# Patient Record
Sex: Male | Born: 1955 | Hispanic: Yes | Marital: Married | Smoking: Former smoker
Health system: Southern US, Community
[De-identification: ages and names within clinical notes are randomized; demographics above are authoritative.]

## PROBLEM LIST (undated history)

## (undated) DIAGNOSIS — G4730 Sleep apnea, unspecified: Secondary | ICD-10-CM

## (undated) DIAGNOSIS — I10 Essential (primary) hypertension: Secondary | ICD-10-CM

## (undated) DIAGNOSIS — C801 Malignant (primary) neoplasm, unspecified: Secondary | ICD-10-CM

## (undated) DIAGNOSIS — K759 Inflammatory liver disease, unspecified: Secondary | ICD-10-CM

## (undated) DIAGNOSIS — K219 Gastro-esophageal reflux disease without esophagitis: Secondary | ICD-10-CM

## (undated) DIAGNOSIS — E785 Hyperlipidemia, unspecified: Secondary | ICD-10-CM

## (undated) DIAGNOSIS — U071 COVID-19: Secondary | ICD-10-CM

## (undated) DIAGNOSIS — Z87442 Personal history of urinary calculi: Secondary | ICD-10-CM

## (undated) DIAGNOSIS — C189 Malignant neoplasm of colon, unspecified: Secondary | ICD-10-CM

## (undated) HISTORY — PX: COLON SURGERY: SHX602

## (undated) HISTORY — DX: Malignant (primary) neoplasm, unspecified: C80.1

## (undated) HISTORY — PX: CARPAL TUNNEL RELEASE: SHX101

## (undated) HISTORY — DX: Essential (primary) hypertension: I10

## (undated) HISTORY — PX: COLONOSCOPY: SHX174

## (undated) HISTORY — PX: TONSILLECTOMY: SUR1361

---

## 2006-08-27 DIAGNOSIS — C801 Malignant (primary) neoplasm, unspecified: Secondary | ICD-10-CM

## 2006-08-27 HISTORY — DX: Malignant (primary) neoplasm, unspecified: C80.1

## 2007-08-28 HISTORY — PX: COLON SURGERY: SHX602

## 2013-04-29 ENCOUNTER — Ambulatory Visit: Payer: Self-pay | Admitting: Physician Assistant

## 2013-04-29 VITALS — BP 126/80 | HR 62 | Temp 98.3°F | Resp 16 | Ht 65.0 in | Wt 156.0 lb

## 2013-04-29 DIAGNOSIS — Z0289 Encounter for other administrative examinations: Secondary | ICD-10-CM

## 2013-04-29 NOTE — Progress Notes (Signed)
UA SP.GR 1.030, Protein neg, blood neg, sugar neg

## 2013-04-29 NOTE — Progress Notes (Signed)
Patient ID: Mark Chapman MRN: [id number], DOB: May 18, 1956 57 y.o. Date of Encounter: 04/29/2013, 3:50 PM  Primary Physician: No primary provider on file.  Chief Complaint: DOT Physical   HPI: 57 y.o. male with history noted below here for DOT physical. Doing well. No issues/complaints. History of well controlled hypertension. On Lisinopril/HCTZ 20/12.5 mg daily. Recently moved from [place]. Does not have a PCP in [place] yet. Needs to get medication transferred to [facility]. Has not taken in 1 week. BP still looks good. No CP, chest tightness, HA, vision changes, or focal deficits. History of colon cancer 2008. Had recent colonoscopy this past year- no sign of recurrence. No issues. No history of sleep apnea, has had sleep study documenting this.  No tobacco, ETOH, or drugs. 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   Review of Systems: Consitutional: No fever, chills, fatigue, night sweats, lymphadenopathy, or weight changes. Eyes: No visual changes, eye redness, or discharge. ENT/Mouth: Ears: No otalgia, tinnitus, hearing loss, discharge. Nose: No congestion, rhinorrhea, sinus pain, or epistaxis. Throat: No sore throat, post nasal drip, or teeth pain. Cardiovascular: No CP, palpitations, diaphoresis, DOE, edema, orthopnea, PND. Respiratory: No cough, hemoptysis, SOB, or wheezing. Gastrointestinal: No anorexia, dysphagia, reflux, pain, nausea, vomiting, hematemesis, diarrhea, constipation, BRBPR, or melena. Genitourinary: No dysuria, frequency, urgency, hematuria, incontinence, nocturia, decreased urinary stream, discharge, impotence, or testicular pain/masses. Musculoskeletal: No decreased ROM, myalgias, stiffness, joint swelling, or weakness. Skin: No rash, erythema, lesion changes, pain, warmth, jaundice, or pruritis. Neurological: No headache, dizziness, syncope, seizures, tremors, memory loss, coordination problems, or paresthesias. Psychological: No anxiety, depression,  hallucinations, SI/HI. Endocrine: No fatigue, polydipsia, polyphagia, polyuria, or known diabetes.   Past Medical History  Diagnosis Date  . Hypertension   . Cancer 2008    Colon     Past Surgical History  Procedure Laterality Date  . Colon surgery      Home Meds:  Prior to Admission medications   Not on File    Allergies: No Known Allergies  History   Social History  . Marital Status: Married    Spouse Name: N/A    Number of Children: N/A  . Years of Education: N/A   Occupational History  . Not on file.   Social History Main Topics  . Smoking status: Never Smoker   . Smokeless tobacco: Not on file  . Alcohol Use: Not on file  . Drug Use: Not on file  . Sexual Activity: Not on file   Other Topics Concern  . Not on file   Social History Narrative  . No narrative on file    History reviewed. No pertinent family history.  Physical Exam: Blood pressure 126/80, pulse 62, temperature 98.3 F (36.8 C), temperature source Oral, resp. rate 16, height 5\' 5"  (1.651 m), weight 156 lb (70.761 kg), SpO2 97.00%.  General: Well developed, well nourished, in no acute distress. HEENT: Normocephalic, atraumatic. Conjunctiva pink, sclera non-icteric. Pupils 2 mm constricting to 1 mm, round, regular, and equally reactive to light and accomodation. EOMI. Internal auditory canal clear. TMs with good cone of light and without pathology. Nasal mucosa pink. Nares are without discharge. No sinus tenderness. Oral mucosa pink. Dentition normal. Pharynx without exudate.   Neck: Supple. Trachea midline. No thyromegaly. Full ROM. No lymphadenopathy. Lungs: Clear to auscultation bilaterally without wheezes, rales, or rhonchi. Breathing is of normal effort and unlabored. Cardiovascular: RRR with S1 S2. No murmurs, rubs, or gallops appreciated. Distal pulses 2+ symmetrically. No  carotid or abdominal bruits. Abdomen: Soft, non-tender, non-distended with normoactive bowel sounds. No  hepatosplenomegaly or masses. No rebound/guarding. No CVA tenderness. Without hernias.  Genitourinary: Uncircumcised male. No penile lesions. Testes descended bilaterally, and smooth without tenderness or masses.  Musculoskeletal: Full range of motion and 5/5 strength throughout. Without swelling, atrophy, tenderness, crepitus, or warmth. Extremities without clubbing, cyanosis, or edema. Calves supple. Skin: Warm and moist without erythema, ecchymosis, wounds, or rash. Neuro: A+Ox3. CN II-XII grossly intact. Moves all extremities spontaneously. Full sensation throughout. Normal gait. DTR 2+ throughout upper and lower extremities. Finger to nose intact. Psych:  Responds to questions appropriately with a normal affect.    Assessment/Plan:  57 y.o. male here for DOT physical with history of hypertension -Cleared -1 year card issued secondary to history of hypertension  -Closely monitor BP -Form completed -Find PCP, offered our assistance  -RTC prn  Signed, Eula Listen, PA-C 04/29/2013 3:50 PM

## 2013-05-12 ENCOUNTER — Encounter: Payer: Self-pay | Admitting: Physician Assistant

## 2013-10-31 ENCOUNTER — Emergency Department (HOSPITAL_COMMUNITY)
Admission: EM | Admit: 2013-10-31 | Discharge: 2013-10-31 | Disposition: A | Discharge status: Home / Self Care | Payer: Medicare Other | Attending: Emergency Medicine | Admitting: Emergency Medicine

## 2013-10-31 ENCOUNTER — Encounter (HOSPITAL_COMMUNITY): Payer: Self-pay | Admitting: Emergency Medicine

## 2013-10-31 ENCOUNTER — Emergency Department (HOSPITAL_COMMUNITY): Payer: Medicare Other

## 2013-10-31 DIAGNOSIS — Z87891 Personal history of nicotine dependence: Secondary | ICD-10-CM | POA: Insufficient documentation

## 2013-10-31 DIAGNOSIS — R0789 Other chest pain: Secondary | ICD-10-CM | POA: Insufficient documentation

## 2013-10-31 DIAGNOSIS — E785 Hyperlipidemia, unspecified: Secondary | ICD-10-CM | POA: Insufficient documentation

## 2013-10-31 DIAGNOSIS — R079 Chest pain, unspecified: Secondary | ICD-10-CM

## 2013-10-31 DIAGNOSIS — I10 Essential (primary) hypertension: Secondary | ICD-10-CM | POA: Insufficient documentation

## 2013-10-31 DIAGNOSIS — F411 Generalized anxiety disorder: Secondary | ICD-10-CM | POA: Insufficient documentation

## 2013-10-31 DIAGNOSIS — Z85038 Personal history of other malignant neoplasm of large intestine: Secondary | ICD-10-CM | POA: Insufficient documentation

## 2013-10-31 DIAGNOSIS — R42 Dizziness and giddiness: Secondary | ICD-10-CM | POA: Insufficient documentation

## 2013-10-31 DIAGNOSIS — Z79899 Other long term (current) drug therapy: Secondary | ICD-10-CM | POA: Insufficient documentation

## 2013-10-31 DIAGNOSIS — F419 Anxiety disorder, unspecified: Secondary | ICD-10-CM

## 2013-10-31 DIAGNOSIS — R0602 Shortness of breath: Secondary | ICD-10-CM | POA: Insufficient documentation

## 2013-10-31 HISTORY — DX: Hyperlipidemia, unspecified: E78.5

## 2013-10-31 HISTORY — DX: Malignant neoplasm of colon, unspecified: C18.9

## 2013-10-31 LAB — CBC
HEMATOCRIT: 43.5 % (ref 39.0–52.0)
Hemoglobin: 15.3 g/dL (ref 13.0–17.0)
MCH: 32.3 pg (ref 26.0–34.0)
MCHC: 35.2 g/dL (ref 30.0–36.0)
MCV: 92 fL (ref 78.0–100.0)
Platelets: 232 10*3/uL (ref 150–400)
RBC: 4.73 MIL/uL (ref 4.22–5.81)
RDW: 12.7 % (ref 11.5–15.5)
WBC: 7.3 10*3/uL (ref 4.0–10.5)

## 2013-10-31 LAB — BASIC METABOLIC PANEL
BUN: 13 mg/dL (ref 6–23)
CHLORIDE: 98 meq/L (ref 96–112)
CO2: 28 mEq/L (ref 19–32)
Calcium: 9.6 mg/dL (ref 8.4–10.5)
Creatinine, Ser: 0.88 mg/dL (ref 0.50–1.35)
GFR calc non Af Amer: >90 mL/min (ref >90)
Glucose, Bld: 89 mg/dL (ref 70–99)
POTASSIUM: 3.6 meq/L — AB (ref 3.7–5.3)
SODIUM: 137 meq/L (ref 137–147)

## 2013-10-31 LAB — I-STAT TROPONIN, ED: Troponin i, poc: 0 ng/mL (ref 0.00–0.08)

## 2013-10-31 MED ORDER — LORAZEPAM 2 MG/ML IJ SOLN
1.0000 mg | Freq: Once | INTRAMUSCULAR | Status: AC
  Administered 2013-10-31: 1 mg via INTRAVENOUS
  Filled 2013-10-31: qty 1

## 2013-10-31 NOTE — Discharge Instructions (Signed)
Chest Pain (Nonspecific) °It is often hard to give a specific diagnosis for the cause of chest pain. There is always a chance that your pain could be related to something serious, such as a heart attack or a blood clot in the lungs. You need to follow up with your caregiver for further evaluation. °CAUSES  °· Heartburn. °· Pneumonia or bronchitis. °· Anxiety or stress. °· Inflammation around your heart (pericarditis) or lung (pleuritis or pleurisy). °· A blood clot in the lung. °· A collapsed lung (pneumothorax). It can develop suddenly on its own (spontaneous pneumothorax) or from injury (trauma) to the chest. °· Shingles infection (herpes zoster virus). °The chest wall is composed of bones, muscles, and cartilage. Any of these can be the source of the pain. °· The bones can be bruised by injury. °· The muscles or cartilage can be strained by coughing or overwork. °· The cartilage can be affected by inflammation and become sore (costochondritis). °DIAGNOSIS  °Lab tests or other studies, such as X-rays, electrocardiography, stress testing, or cardiac imaging, may be needed to find the cause of your pain.  °TREATMENT  °· Treatment depends on what may be causing your chest pain. Treatment may include: °· Acid blockers for heartburn. °· Anti-inflammatory medicine. °· Pain medicine for inflammatory conditions. °· Antibiotics if an infection is present. °· You may be advised to change lifestyle habits. This includes stopping smoking and avoiding alcohol, caffeine, and chocolate. °· You may be advised to keep your head raised (elevated) when sleeping. This reduces the chance of acid going backward from your stomach into your esophagus. °· Most of the time, nonspecific chest pain will improve within 2 to 3 days with rest and mild pain medicine. °HOME CARE INSTRUCTIONS  °· If antibiotics were prescribed, take your antibiotics as directed. Finish them even if you start to feel better. °· For the next few days, avoid physical  activities that bring on chest pain. Continue physical activities as directed. °· Do not smoke. °· Avoid drinking alcohol. °· Only take over-the-counter or prescription medicine for pain, discomfort, or fever as directed by your caregiver. °· Follow your caregiver's suggestions for further testing if your chest pain does not go away. °· Keep any follow-up appointments you made. If you do not go to an appointment, you could develop lasting (chronic) problems with pain. If there is any problem keeping an appointment, you must call to reschedule. °SEEK MEDICAL CARE IF:  °· You think you are having problems from the medicine you are taking. Read your medicine instructions carefully. °· Your chest pain does not go away, even after treatment. °· You develop a rash with blisters on your chest. °SEEK IMMEDIATE MEDICAL CARE IF:  °· You have increased chest pain or pain that spreads to your arm, neck, jaw, back, or abdomen. °· You develop shortness of breath, an increasing cough, or you are coughing up blood. °· You have severe back or abdominal pain, feel nauseous, or vomit. °· You develop severe weakness, fainting, or chills. °· You have a fever. °THIS IS AN EMERGENCY. Do not wait to see if the pain will go away. Get medical help at once. Call your local emergency services (911 in [place] Do not drive yourself to the hospital. °MAKE SURE YOU:  °· Understand these instructions. °· Will watch your condition. °· Will get help right away if you are not doing well or get worse. °Document Released: 05/23/2005 Document Revised: 11/05/2011 Document Reviewed: 03/18/2008 °ExitCare® Patient Information ©2014 [employer]   [employer]. ° °Panic Attacks °Panic attacks are sudden, short-lived surges of severe anxiety, fear, or discomfort. They may occur for no reason when you are relaxed, when you are anxious, or when you are sleeping. Panic attacks may occur for a number of reasons:  °· Healthy people occasionally have panic attacks in extreme,  life-threatening situations, such as war or natural disasters. Normal anxiety is a protective mechanism of the body that helps us react to danger (fight or flight response). °· Panic attacks are often seen with anxiety disorders, such as panic disorder, social anxiety disorder, generalized anxiety disorder, and phobias. Anxiety disorders cause excessive or uncontrollable anxiety. They may interfere with your relationships or other life activities. °· Panic attacks are sometimes seen with other mental illnesses such as depression and posttraumatic stress disorder. °· Certain medical conditions, prescription medicines, and drugs of abuse can cause panic attacks. °SYMPTOMS  °Panic attacks start suddenly, peak within 20 minutes, and are accompanied by four or more of the following symptoms: °· Pounding heart or fast heart rate (palpitations). °· Sweating. °· Trembling or shaking. °· Shortness of breath or feeling smothered. °· Feeling choked. °· Chest pain or discomfort. °· Nausea or strange feeling in your stomach. °· Dizziness, lightheadedness, or feeling like you will faint. °· Chills or hot flushes. °· Numbness or tingling in your lips or hands and feet. °· Feeling that things are not real or feeling that you are not yourself. °· Fear of losing control or going crazy. °· Fear of dying. °Some of these symptoms can mimic serious medical conditions. For example, you may think you are having a heart attack. Although panic attacks can be very scary, they are not life threatening. °DIAGNOSIS  °Panic attacks are diagnosed through an assessment by your health care provider. Your health care provider will ask questions about your symptoms, such as where and when they occurred. Your health care provider will also ask about your medical history and use of alcohol and drugs, including prescription medicines. Your health care provider may order blood tests or other studies to rule out a serious medical condition. Your health  care provider may refer you to a mental health professional for further evaluation. °TREATMENT  °· Most healthy people who have one or two panic attacks in an extreme, life-threatening situation will not require treatment. °· The treatment for panic attacks associated with anxiety disorders or other mental illness typically involves counseling with a mental health professional, medicine, or a combination of both. Your health care provider will help determine what treatment is best for you. °· Panic attacks due to physical illness usually goes away with treatment of the illness. If prescription medicine is causing panic attacks, talk with your health care provider about stopping the medicine, decreasing the dose, or substituting another medicine. °· Panic attacks due to alcohol or drug abuse goes away with abstinence. Some adults need professional help in order to stop drinking or using drugs. °HOME CARE INSTRUCTIONS  °· Take all your medicines as prescribed.   °· Check with your health care provider before starting new prescription or over-the-counter medicines. °· Keep all follow up appointments with your health care provider. °SEEK MEDICAL CARE IF: °· You are not able to take your medicines as prescribed. °· Your symptoms do not improve or get worse. °SEEK IMMEDIATE MEDICAL CARE IF:  °· You experience panic attack symptoms that are different than your usual symptoms. °· You have serious thoughts about hurting yourself or others. °· You are taking medicine for   panic attacks and have a serious side effect. °MAKE SURE YOU: °· Understand these instructions. °· Will watch your condition. °· Will get help right away if you are not doing well or get worse. °Document Released: 08/13/2005 Document Revised: 06/03/2013 Document Reviewed: 03/27/2013 °ExitCare® Patient Information ©2014 [employer]. ° °

## 2013-10-31 NOTE — ED Provider Notes (Signed)
CSN: [id number]     Arrival date & time 10/31/13  0941 History   First MD Initiated Contact with Patient 10/31/13 [phone]     Chief Complaint  Patient presents with  . Chest Pain  . Shortness of Breath     (Consider location/radiation/quality/duration/timing/severity/associated sxs/prior Treatment) HPI Comments: Patient is a 58 year old male with a past medical history of hypertension, hyperlipidemia and colon cancer who presents to the emergency department complaining of intermittent chest pain x 4 years. Symptoms are described as chest pressure, occasionally shooting down his left arm. He cannot recall anything in particular making his symptoms, or go. States in 2011 he had a stress test in [place] because he was complaining of chest pressure, stress test results were unremarkable, concerned because they injected him with a dye, and he believes the dye may be causing his symptoms. Admits to associated shortness of breath and occasional lightheadedness. States he saw a new PCP recently Dr. Moreen Fowler who told him he had high cholesterol, was put on Lipitor and told to start exercising. Denies fever, chills, extremity edema, cough.  Patient is a 58 y.o. male presenting with chest pain and shortness of breath. The history is provided by the patient.  Chest Pain Associated symptoms: shortness of breath   Shortness of Breath Associated symptoms: chest pain     Past Medical History  Diagnosis Date  . Hypertension   . Cancer 2008    Colon  . Colon cancer   . Hyperlipidemia    Past Surgical History  Procedure Laterality Date  . Colon surgery     History reviewed. No pertinent family history. History  Substance Use Topics  . Smoking status: Former Research scientist (life sciences)  . Smokeless tobacco: Not on file  . Alcohol Use: No    Review of Systems  Respiratory: Positive for shortness of breath.   Cardiovascular: Positive for chest pain.  Neurological: Positive for light-headedness.  All other systems  reviewed and are negative.      Allergies  Review of patient's allergies indicates no known allergies.  Home Medications   Current Outpatient Rx  Name  Route  Sig  Dispense  Refill  . atorvastatin (LIPITOR) 20 MG tablet   Oral   Take 20 mg by mouth daily.         Marland Kitchen lisinopril-hydrochlorothiazide (PRINZIDE,ZESTORETIC) 20-12.5 MG per tablet   Oral   Take 1 tablet by mouth daily.         . Multiple Vitamins-Minerals (MULTIVITAMIN PO)   Oral   Take 1 tablet by mouth daily.         Marland Kitchen omeprazole (PRILOSEC) 40 MG capsule   Oral   Take 40 mg by mouth daily as needed (for stomach pain).         Marland Kitchen oxyCODONE-acetaminophen (PERCOCET) 10-325 MG per tablet   Oral   Take 1 tablet by mouth every 4 (four) hours as needed for pain.         . vitamin B-12 (CYANOCOBALAMIN) 1000 MCG tablet   Oral   Take 1,000 mcg by mouth daily.          BP 135/91  Pulse 70  Temp(Src) 97.2 F (36.2 C) (Oral)  Resp 8  Ht 5\' 7"  (1.702 m)  Wt 163 lb 3.2 oz (74.027 kg)  BMI 25.55 kg/m2  SpO2 100% Physical Exam  Nursing note and vitals reviewed. Constitutional: He is oriented to person, place, and time. He appears well-developed and well-nourished. No distress.  HENT:  Head:  Normocephalic and atraumatic.  Mouth/Throat: Oropharynx is clear and moist.  Eyes: Conjunctivae and EOM are normal. Pupils are equal, round, and reactive to light.  Neck: Normal range of motion. Neck supple. No JVD present.  Cardiovascular: Normal rate, regular rhythm, normal heart sounds and intact distal pulses.   No extremity edema.  Pulmonary/Chest: Effort normal and breath sounds normal. He exhibits no tenderness.  Abdominal: Soft. Bowel sounds are normal. There is no tenderness.  Musculoskeletal: Normal range of motion. He exhibits no edema.  Neurological: He is alert and oriented to person, place, and time.  Moves limbs without ataxia. Speech fluent, goal oriented. Equal grip strength bilateral.  Skin:  Skin is warm and dry. He is not diaphoretic.  Psychiatric: His behavior is normal. His mood appears anxious.    ED Course  Procedures (including critical care time) Labs Review Labs Reviewed  BASIC METABOLIC PANEL - Abnormal; Notable for the following:    Potassium 3.6 (*)    All other components within normal limits  CBC  I-STAT TROPOININ, ED   Imaging Review Dg Chest 2 View  10/31/2013   CLINICAL DATA:  Chest pain  EXAM: CHEST  2 VIEW  COMPARISON:  None.  FINDINGS: The heart size and mediastinal contours are within normal limits. Both lungs are clear. The visualized skeletal structures are unremarkable.  IMPRESSION: No active cardiopulmonary disease.   Electronically Signed   By: Kerby Moors M.D.   On: 10/31/2013 12:16     EKG Interpretation   Date/Time:  Saturday October 31 2013 09:43:13 EST Ventricular Rate:  81 PR Interval:  142 QRS Duration: 86 QT Interval:  376 QTC Calculation: 436 R Axis:   66 Text Interpretation:  Normal sinus rhythm Normal ECG Confirmed by ZAVITZ   MD, JOSHUA ([id number]) on 10/31/2013 12:39:42 PM      MDM   Final diagnoses:  Chest pain  Anxiety    Patient presenting with chest pain, intermittent for 4 years. He is well appearing and in no apparent distress, stable vital signs. Patient seems anxious, concerned that a dye injected with a stress test back in 2011 may be causing his symptoms. Stress test at that time unremarkable. Doubt cardiac or pulmonary in origin. Labs pending. EKG unremarkable. Plan to give Ativan, if labs normal, discharge home with outpatient cardiology followup. 12:42 PM Workup unremarkable. Pt reports complete improvement of his symptoms with Ativan. Symptoms most likely related to anxiety. I advised him to followup with his PCP to discuss anxiety, also given resources for cardiology followup. Low risk HEART score 2. Stable for d/c. Case discussed with attending Dr. Reather Converse who also evaluated patient and agrees with plan of  care.   Illene Labrador, PA-C 10/31/13 1249

## 2013-10-31 NOTE — ED Notes (Signed)
Pt had stress test in [place] in 2011. Ever since this test he has had intermittent chest pressure. He states it occasionally shoots down his left arm. Pt denies N/V, he does state that he is light headed at times.

## 2013-11-01 NOTE — ED Provider Notes (Signed)
Medical screening examination/treatment/procedure(s) were conducted as a shared visit with non-physician practitioner(s) or resident  and myself.  I personally evaluated the patient during the encounter and agree with the findings and plan unless otherwise indicated.    I have personally reviewed any xrays and/ or EKG's with the provider and I agree with interpretation.   Atypical chest pain for 4 years since having contrast dye.  Pt low risk cardiac.  Pt has had constant pain for one month.  No exertional or diaphoresis.  Pt sxs resolved in ED.  EKG no acute findings.  No concern for PE at this time. Exam RRR, lungs clear, no leg pain or swelling, well appearing.      EKG Interpretation   Date/Time:  Saturday October 31 2013 09:43:13 EST Ventricular Rate:  81 PR Interval:  142 QRS Duration: 86 QT Interval:  376 QTC Calculation: 436 R Axis:   66 Text Interpretation:  Normal sinus rhythm Normal ECG Confirmed by Angelina Venard   MD, [place]lds ([id number]) on 10/31/2013 12:39:42 PM     Atypical chest pain   Mariea Clonts, MD 11/01/13 2144

## 2013-11-11 ENCOUNTER — Ambulatory Visit: Payer: Medicare Other | Admitting: Cardiology

## 2013-11-18 ENCOUNTER — Ambulatory Visit: Payer: Medicare Other | Admitting: Cardiology

## 2013-11-25 ENCOUNTER — Encounter: Payer: Self-pay | Admitting: Cardiology

## 2013-11-25 ENCOUNTER — Ambulatory Visit: Payer: Medicare Other | Attending: Cardiology | Admitting: Cardiology

## 2013-11-25 VITALS — BP 128/80 | HR 79 | Temp 98.6°F | Resp 16 | Ht 66.0 in | Wt 164.0 lb

## 2013-11-25 DIAGNOSIS — F329 Major depressive disorder, single episode, unspecified: Secondary | ICD-10-CM

## 2013-11-25 DIAGNOSIS — F419 Anxiety disorder, unspecified: Secondary | ICD-10-CM

## 2013-11-25 DIAGNOSIS — F411 Generalized anxiety disorder: Secondary | ICD-10-CM | POA: Insufficient documentation

## 2013-11-25 DIAGNOSIS — E785 Hyperlipidemia, unspecified: Secondary | ICD-10-CM | POA: Insufficient documentation

## 2013-11-25 DIAGNOSIS — F341 Dysthymic disorder: Secondary | ICD-10-CM

## 2013-11-25 DIAGNOSIS — F3289 Other specified depressive episodes: Secondary | ICD-10-CM | POA: Insufficient documentation

## 2013-11-25 DIAGNOSIS — R079 Chest pain, unspecified: Secondary | ICD-10-CM

## 2013-11-25 DIAGNOSIS — I10 Essential (primary) hypertension: Secondary | ICD-10-CM | POA: Insufficient documentation

## 2013-11-25 NOTE — Progress Notes (Signed)
HPI [name] is a 58 year old male who comes in today referred for chest pain. He was recently evaluated in emergency room for a pressure over his left breast. Cardiac markers were negative, EKG was normal, and chest x-ray was normal.  She has a lot of anxiety and depression. 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 He shared that he has a bad marriage. He had a bad argument last night which made his discomfort worse.  It is not related to exertion or activity. He denies any cough, hemoptysis, nausea, vomiting, dysphagia, palpitations or shortness of breath.  Past Medical History  Diagnosis Date  . Hypertension   . Cancer 2008    Colon  . Colon cancer   . Hyperlipidemia     Current Outpatient Prescriptions  Medication Sig Dispense Refill  . atorvastatin (LIPITOR) 20 MG tablet Take 20 mg by mouth daily.      Marland Kitchen lisinopril-hydrochlorothiazide (PRINZIDE,ZESTORETIC) 20-12.5 MG per tablet Take 1 tablet by mouth daily.      . Multiple Vitamins-Minerals (MULTIVITAMIN PO) Take 1 tablet by mouth daily.      Marland Kitchen omeprazole (PRILOSEC) 40 MG capsule Take 40 mg by mouth daily as needed (for stomach pain).      Marland Kitchen oxyCODONE-acetaminophen (PERCOCET) 10-325 MG per tablet Take 1 tablet by mouth every 4 (four) hours as needed for pain.      . vitamin B-12 (CYANOCOBALAMIN) 1000 MCG tablet Take 1,000 mcg by mouth daily.       No current facility-administered medications for this visit.    No Known Allergies  History reviewed. No pertinent family history.  History   Social History  . Marital Status: Married    Spouse Name: N/A    Number of Children: N/A  . Years of Education: N/A   Occupational History  . Not on file.   Social History Main Topics  . Smoking status: Former Research scientist (life sciences)  . Smokeless tobacco: Not on file  . Alcohol Use: No  . Drug Use: No  . Sexual Activity: Yes   Other Topics Concern  . Not on file   Social History Narrative  . No narrative on file    ROS ALL  NEGATIVE EXCEPT THOSE NOTED IN HPI  PE  General Appearance: well developed, well nourished in no acute distress HEENT: symmetrical face, PERRLA, good dentition  Neck: no JVD, thyromegaly, or adenopathy, trachea midline Chest: symmetric without deformity, no chest Mark Chapman tenderness Cardiac: PMI non-displaced, RRR, normal S1, S2, no gallop or murmur Lung: clear to ausculation and percussion Vascular: all pulses full without bruits  Abdominal: nondistended, nontender, good bowel sounds, no HSM, no bruits Extremities: no cyanosis, clubbing or edema, no sign of DVT, no varicosities  Skin: normal color, no rashes Neuro: alert and oriented x 3, non-focal Pysch: Anxious  EKG  BMET    Component Value Date/Time   NA 137 10/31/2013 1033   K 3.6* 10/31/2013 1033   CL 98 10/31/2013 1033   CO2 28 10/31/2013 1033   GLUCOSE 89 10/31/2013 1033   BUN 13 10/31/2013 1033   CREATININE 0.88 10/31/2013 1033   CALCIUM 9.6 10/31/2013 1033   GFRNONAA >90 10/31/2013 1033   GFRAA >90 10/31/2013 1033    Lipid Panel  No results found for this basename: chol, trig, hdl, cholhdl, vldl, ldlcalc    CBC    Component Value Date/Time   WBC 7.3 10/31/2013 1033   RBC 4.73 10/31/2013 1033   HGB 15.3 10/31/2013 1033   HCT  43.5 10/31/2013 1033   PLT 232 10/31/2013 1033   MCV 92.0 10/31/2013 1033   MCH 32.3 10/31/2013 1033   MCHC 35.2 10/31/2013 1033   RDW 12.7 10/31/2013 1033

## 2013-11-25 NOTE — Patient Instructions (Signed)
Pt instructed to f/u with provided Psychiatry referral

## 2013-11-25 NOTE — Addendum Note (Signed)
Addended by: Candie Chroman D on: 11/25/2013 04:32 PM   Modules accepted: Orders

## 2013-11-25 NOTE — Assessment & Plan Note (Signed)
This is noncardiac. There is no evidence of costochondritis or muscle skeletal disorder. I think this is mostly related to his anxiety. No further cardiac workup indicated.

## 2013-11-25 NOTE — Progress Notes (Signed)
Pt here to f/u with Dr. Verl Blalock for chest pain  C/o intermit tightness,nonradiating Work-up in ER negative for Cardiac issues possibly anxiety Pt will f/u with [facility] for mental health referral

## 2014-09-24 DIAGNOSIS — K219 Gastro-esophageal reflux disease without esophagitis: Secondary | ICD-10-CM | POA: Diagnosis not present

## 2014-09-24 DIAGNOSIS — Z23 Encounter for immunization: Secondary | ICD-10-CM | POA: Diagnosis not present

## 2014-09-24 DIAGNOSIS — F4310 Post-traumatic stress disorder, unspecified: Secondary | ICD-10-CM | POA: Diagnosis not present

## 2014-09-24 DIAGNOSIS — I10 Essential (primary) hypertension: Secondary | ICD-10-CM | POA: Diagnosis not present

## 2014-09-24 DIAGNOSIS — F329 Major depressive disorder, single episode, unspecified: Secondary | ICD-10-CM | POA: Diagnosis not present

## 2014-09-24 DIAGNOSIS — E782 Mixed hyperlipidemia: Secondary | ICD-10-CM | POA: Diagnosis not present

## 2014-09-24 DIAGNOSIS — M25559 Pain in unspecified hip: Secondary | ICD-10-CM | POA: Diagnosis not present

## 2014-10-22 ENCOUNTER — Telehealth: Payer: Self-pay | Admitting: Emergency Medicine

## 2014-10-22 NOTE — Telephone Encounter (Signed)
Form was dropped off by wife on 09/30/2014 for completion (Hand[employer] Placard) Form is ready for pick-up will hand off to Carmela Hurt RN

## 2014-12-07 DIAGNOSIS — F332 Major depressive disorder, recurrent severe without psychotic features: Secondary | ICD-10-CM | POA: Diagnosis not present

## 2014-12-07 DIAGNOSIS — J309 Allergic rhinitis, unspecified: Secondary | ICD-10-CM | POA: Diagnosis not present

## 2015-03-25 DIAGNOSIS — E782 Mixed hyperlipidemia: Secondary | ICD-10-CM | POA: Diagnosis not present

## 2015-03-25 DIAGNOSIS — F4310 Post-traumatic stress disorder, unspecified: Secondary | ICD-10-CM | POA: Diagnosis not present

## 2015-03-25 DIAGNOSIS — K219 Gastro-esophageal reflux disease without esophagitis: Secondary | ICD-10-CM | POA: Diagnosis not present

## 2015-03-25 DIAGNOSIS — M25559 Pain in unspecified hip: Secondary | ICD-10-CM | POA: Diagnosis not present

## 2015-03-25 DIAGNOSIS — I10 Essential (primary) hypertension: Secondary | ICD-10-CM | POA: Diagnosis not present

## 2015-04-18 DIAGNOSIS — H5210 Myopia, unspecified eye: Secondary | ICD-10-CM | POA: Diagnosis not present

## 2015-04-18 DIAGNOSIS — H524 Presbyopia: Secondary | ICD-10-CM | POA: Diagnosis not present

## 2015-05-04 DIAGNOSIS — M25551 Pain in right hip: Secondary | ICD-10-CM | POA: Diagnosis not present

## 2015-05-07 DIAGNOSIS — H5203 Hypermetropia, bilateral: Secondary | ICD-10-CM | POA: Diagnosis not present

## 2015-05-07 DIAGNOSIS — H52209 Unspecified astigmatism, unspecified eye: Secondary | ICD-10-CM | POA: Diagnosis not present

## 2015-05-07 DIAGNOSIS — Z0100 Encounter for examination of eyes and vision without abnormal findings: Secondary | ICD-10-CM | POA: Diagnosis not present

## 2015-05-13 DIAGNOSIS — M1611 Unilateral primary osteoarthritis, right hip: Secondary | ICD-10-CM | POA: Diagnosis not present

## 2015-05-19 DIAGNOSIS — M1611 Unilateral primary osteoarthritis, right hip: Secondary | ICD-10-CM | POA: Diagnosis not present

## 2015-06-07 DIAGNOSIS — M1611 Unilateral primary osteoarthritis, right hip: Secondary | ICD-10-CM | POA: Diagnosis not present

## 2015-09-26 DIAGNOSIS — Z23 Encounter for immunization: Secondary | ICD-10-CM | POA: Diagnosis not present

## 2015-09-26 DIAGNOSIS — I10 Essential (primary) hypertension: Secondary | ICD-10-CM | POA: Diagnosis not present

## 2015-09-26 DIAGNOSIS — E782 Mixed hyperlipidemia: Secondary | ICD-10-CM | POA: Diagnosis not present

## 2015-09-26 DIAGNOSIS — Z85038 Personal history of other malignant neoplasm of large intestine: Secondary | ICD-10-CM | POA: Diagnosis not present

## 2015-09-26 DIAGNOSIS — F4310 Post-traumatic stress disorder, unspecified: Secondary | ICD-10-CM | POA: Diagnosis not present

## 2015-09-26 DIAGNOSIS — K219 Gastro-esophageal reflux disease without esophagitis: Secondary | ICD-10-CM | POA: Diagnosis not present

## 2015-09-26 DIAGNOSIS — M25559 Pain in unspecified hip: Secondary | ICD-10-CM | POA: Diagnosis not present

## 2015-09-26 DIAGNOSIS — B1920 Unspecified viral hepatitis C without hepatic coma: Secondary | ICD-10-CM | POA: Diagnosis not present

## 2015-12-19 DIAGNOSIS — Z79899 Other long term (current) drug therapy: Secondary | ICD-10-CM | POA: Diagnosis not present

## 2015-12-19 DIAGNOSIS — Z5181 Encounter for therapeutic drug level monitoring: Secondary | ICD-10-CM | POA: Diagnosis not present

## 2019-03-28 ENCOUNTER — Other Ambulatory Visit: Payer: Self-pay

## 2019-03-28 ENCOUNTER — Emergency Department (HOSPITAL_COMMUNITY)
Admission: EM | Admit: 2019-03-28 | Discharge: 2019-03-28 | Disposition: A | Discharge status: Home / Self Care | Payer: Medicare Other | Attending: Emergency Medicine | Admitting: Emergency Medicine

## 2019-03-28 ENCOUNTER — Encounter (HOSPITAL_COMMUNITY): Payer: Self-pay | Admitting: Emergency Medicine

## 2019-03-28 DIAGNOSIS — I10 Essential (primary) hypertension: Secondary | ICD-10-CM | POA: Insufficient documentation

## 2019-03-28 DIAGNOSIS — Z87891 Personal history of nicotine dependence: Secondary | ICD-10-CM | POA: Diagnosis not present

## 2019-03-28 DIAGNOSIS — F4322 Adjustment disorder with anxiety: Secondary | ICD-10-CM | POA: Diagnosis not present

## 2019-03-28 DIAGNOSIS — F39 Unspecified mood [affective] disorder: Secondary | ICD-10-CM

## 2019-03-28 DIAGNOSIS — F29 Unspecified psychosis not due to a substance or known physiological condition: Secondary | ICD-10-CM | POA: Diagnosis present

## 2019-03-28 DIAGNOSIS — Z79899 Other long term (current) drug therapy: Secondary | ICD-10-CM | POA: Insufficient documentation

## 2019-03-28 LAB — COMPREHENSIVE METABOLIC PANEL
ALT: 22 U/L (ref 0–44)
AST: 26 U/L (ref 15–41)
Albumin: 4.4 g/dL (ref 3.5–5.0)
Alkaline Phosphatase: 71 U/L (ref 38–126)
Anion gap: 11 (ref 5–15)
BUN: 11 mg/dL (ref 8–23)
CO2: 25 mmol/L (ref 22–32)
Calcium: 9.5 mg/dL (ref 8.9–10.3)
Chloride: 105 mmol/L (ref 98–111)
Creatinine, Ser: 1.14 mg/dL (ref 0.61–1.24)
GFR calc Af Amer: >60 mL/min (ref >60)
GFR calc non Af Amer: >60 mL/min (ref >60)
Glucose, Bld: 108 mg/dL — ABNORMAL HIGH (ref 70–99)
Potassium: 3.5 mmol/L (ref 3.5–5.1)
Sodium: 141 mmol/L (ref 135–145)
Total Bilirubin: 1 mg/dL (ref 0.3–1.2)
Total Protein: 7.6 g/dL (ref 6.5–8.1)

## 2019-03-28 LAB — CBC
HCT: 44.4 % (ref 39.0–52.0)
Hemoglobin: 15.1 g/dL (ref 13.0–17.0)
MCH: 31.7 pg (ref 26.0–34.0)
MCHC: 34 g/dL (ref 30.0–36.0)
MCV: 93.1 fL (ref 80.0–100.0)
Platelets: 258 10*3/uL (ref 150–400)
RBC: 4.77 MIL/uL (ref 4.22–5.81)
RDW: 12.2 % (ref 11.5–15.5)
WBC: 7.8 10*3/uL (ref 4.0–10.5)
nRBC: 0 % (ref 0.0–0.2)

## 2019-03-28 LAB — RAPID URINE DRUG SCREEN, HOSP PERFORMED
Amphetamines: NOT DETECTED
Barbiturates: NOT DETECTED
Benzodiazepines: NOT DETECTED
Cocaine: NOT DETECTED
Opiates: NOT DETECTED
Tetrahydrocannabinol: POSITIVE — AB

## 2019-03-28 LAB — ACETAMINOPHEN LEVEL: Acetaminophen (Tylenol), Serum: <10 ug/mL — ABNORMAL LOW (ref 10–30)

## 2019-03-28 LAB — SALICYLATE LEVEL: Salicylate Lvl: <7 mg/dL (ref 2.8–30.0)

## 2019-03-28 LAB — ETHANOL: Alcohol, Ethyl (B): <10 mg/dL (ref <10)

## 2019-03-28 NOTE — ED Notes (Signed)
TTS machine set up for pt assessment

## 2019-03-28 NOTE — ED Notes (Signed)
Pt reported being awake approximately 24 hours.  Will defer VS until pt wakes up.

## 2019-03-28 NOTE — Progress Notes (Signed)
TTS spoke with Mel Almond, RN who states pt is now with a new nurse, Morey Hummingbird at ext [phone]. TTS to call pt's new nurse in order to complete assessment.   Lind Covert, MSW, LCSW Therapeutic Triage Specialist  [phone]

## 2019-03-28 NOTE — ED Triage Notes (Signed)
TC from [facility]  NP reporting pt was cleared by psychiatrist . Waiting  for note to be entered by NP.

## 2019-03-28 NOTE — Progress Notes (Signed)
TTS calling cart, going straight to "call ended."  Lind Covert, MSW, LCSW Therapeutic Triage Specialist  [phone]

## 2019-03-28 NOTE — ED Notes (Signed)
Pt in purple scrubs. Pt has been wanded

## 2019-03-28 NOTE — Discharge Instructions (Addendum)
It was our pleasure to provide your ER care today - we hope that you feel better.  Follow up with primary care doctor and counselor/therapist in the next few weeks.  Return to ER if worse, new symptoms, thoughts of harm to self or others, severe depression, or other concern.   If future mental health issues and/or crisis, you may go directly to the behavioral health center/hospital.

## 2019-03-28 NOTE — ED Provider Notes (Signed)
Patient seen/examined in the Emergency Department in conjunction with Advanced Practice Provider  Patient reports he is not sure why he is in the emergency department.  Reports he works 2 jobs and is really tired and would like to sleep. Exam : Resting comfortably, no acute distress. Plan: Patient arrived under IVC.  I will go forward with first examination paperwork.    Ripley Fraise, MD 03/28/19 [phone]

## 2019-03-28 NOTE — Consult Note (Signed)
Telepsych Consultation   Reason for Consult:  Psychosis  Referring Physician:  EDP  Location of Patient: [facility] ED [phone] Location of Provider: [facility]  Patient Identification: Mark Chapman MRN:  [id number] Principal Diagnosis: <principal problem not specified> Diagnosis:  Active Problems:   * No active hospital problems. *   Total Time spent with patient: 15 minutes  Subjective:     HPI: Mark Chapman is an 63 y.o. male who presents to the ED under IVC initiated by his wife. Per IVC, respondnt "texted me a long tett today saying that if God wanted his life, he was ready to go. The respondent went to the police earlier today saying delusional things to officer K.A. Ryals of the [employer] He was telling them that I was involved in a cult, that I was trying to have him murdered, that his children were in danger but he was having them watched. It ended up tonight that my husband went to my friends home to tell her to stay away from me. He even went to the so called "assasians's home" to take care of the problem. Then he came to my home. My husband is delusional and his behavior is escalating to the point where I am scared for my life, my children's life as well as his own."   TTS spoke with the pt who vehemently denies all of the information reported on the IVC. Pt states he has not seen his wife in a week because she got a restraining order against him and they have been having marital conflict. Pt states he moved out of their home due to the conflict and has been at a hotel. Pt states he has been married for 20 years but he is unhappy and is getting a divorce. Pt states he frequently gives his wife money and she gives it to her friend. Pt states he told his wife that he wanted his children to stay away from the friend but he states he told her via phone and denies that he went to his wife's home or the friend's home. Pt states he feels that his wife is  doing this out of anger and denies SI, HI, and AVH. Pt also denies this to the EDP and ED staff.  Psychiatric consultation: Mark Chapman is a 63 y.o. male who initially presented to the ED under IVC initiated by his wife. information from IVC is as noted above. During this evaluation, patient is alert and oriented x3 calm and cooperative. He denies active or passive suicidal thoughts, homicidal ideas or psychosis. He reports he has had ongoing marital problems which has worsened in the past four months after his wife became friends with a lady from church. Reports since his wife has become friends with a lady from church, she does not come home and and she has changed., Reports he does not trust the lady being around his 49 and 45 year old daughters. Reports He went to the ladies home and asked her if she could stay away from his daughters and his wife. Reports he does fear for his wife and kids safety although reports he does not believe that anyone is going to murder them or murder him. Reports later yesterday  got a call saying that his wife was taking a 50b out on him and that his wife thinks he is delusional. He denies a history of mental diagnoses. Denies previous attempts to harm himself.  Denies regular alcohol use although does admit to  smoking marijuana. He UDS is positive for THC only. Reports he is now out of [employer] and living in a hotel.   Collateral information: Verbal permission given by patient to speak with his wife Mark Chapman and stepdaughter Mark Chapman.  Per Mrs. Angelino, they have been legally married for 20 years but are now separated and he is not living in the home. She reports patient has a history of [address] drugs and abusing pain pills. Reports patient has a history of being physically aggressive although he has not been physically aggressive in years. She does report that he is verbally aggressive. Reports patient is upset because she has become very close with a  church member. Reports he does not trust the church member for some reason and he recently went to her home and told her not to be around her or their daughters. Reports patient is delusional when it comes to the church member thinking that she is truing to harm them. Reports he told the police that he felt as though she was trying to plot to murder him although this is not true. When asked if she felt as though he was a threat to himself or others she replied, " Yes because he is sending messages and cursing me out lie, " fucking stupid ass."  Reports he also sent a message saying that he wish God took his life. Reports she has never known for patient to harm himself in the past.  Reports he does struggle with anger issues. Reports they have struggles with domestic issues for many years during their marriage. Reports he mocks her faith in God. Reports he sent a text message to his daughter and stated that he had people watching over them and that is why she is concerned for their safety,.   Mark Chapman reports she does not know much of the story as both her mother and stepfather and telling two different things. Reports she does know that they have had ongoing domestic issues throughout their marriage. Reports patient is verbally aggressive at times although denies any recent physical aggression. Reports she does know that patient had a history of substance abuse although yesterday, patient stated that he had not used any substances for months. Reports she has never known patient to say he was hearing or seeing things. Reports she does not believe that he is delusional although yesterday he did say that he felt as though patients mother was plotting to kill him. When asked why, she states that patient said it can happen, you see it on TV. Reports both her mother and stepfather are going through their issues. Reports she has never known patient to harm himself and she can not that patient is a danger to  himself. States that all this did come about after hermother started hanging with a new church frined.    Past Psychiatric History: Substance abuse   Risk to Self: Suicidal Ideation: No Suicidal Intent: No Is patient at risk for suicide?: No Suicidal Plan?: No Access to Means: No What has been your use of drugs/alcohol within the last 12 months?: labs positive for cannabis Triggers for Past Attempts: None known Intentional Self Injurious Behavior: None Risk to Others: Homicidal Ideation: Yes-Currently Present(pt denies, IVC reports pt made HI) Thoughts of Harm to Others: Yes-Currently Present Comment - Thoughts of Harm to Others: per IVC pt made threats to kill others, pt denies this  Current Homicidal Intent: No Current Homicidal Plan: No Access to Homicidal Means: No  History of harm to others?: No Assessment of Violence: None Noted Does patient have access to weapons?: No Criminal Charges Pending?: Yes Describe Pending Criminal Charges: protection order Does patient have a court date: Yes Court Date: 04/01/19 Prior Inpatient Therapy: Prior Inpatient Therapy: No Prior Outpatient Therapy: Prior Outpatient Therapy: No Does patient have an ACCT team?: No Does patient have Intensive In-House Services?  : No Does patient have [facility] services? : No Does patient have P4CC services?: No  Past Medical History:  Past Medical History:  Diagnosis Date  . Cancer [facility]) 2008   Colon  . Colon cancer ([place])   . Hyperlipidemia   . Hypertension     Past Surgical History:  Procedure Laterality Date  . COLON SURGERY     Family History: History reviewed. No pertinent family history. Family Psychiatric  History: None noted in chart.  Social History:  Social History   Substance and Sexual Activity  Alcohol Use No     Social History   Substance and Sexual Activity  Drug Use No    Social History   Socioeconomic History  . Marital status: Married    Spouse name: Not on file  .  Number of children: Not on file  . Years of education: Not on file  . Highest education level: Not on file  Occupational History  . Not on file  Social Needs  . Financial resource strain: Not on file  . Food insecurity    Worry: Not on file    Inability: Not on file  . Transportation needs    Medical: Not on file    Non-medical: Not on file  Tobacco Use  . Smoking status: Former Research scientist (life sciences)  . Smokeless tobacco: Never Used  Substance and Sexual Activity  . Alcohol use: No  . Drug use: No  . Sexual activity: Yes  Lifestyle  . Physical activity    Days per week: Not on file    Minutes per session: Not on file  . Stress: Not on file  Relationships  . Social Herbalist on phone: Not on file    Gets together: Not on file    Attends religious service: Not on file    Active member of club or organization: Not on file    Attends meetings of clubs or organizations: Not on file    Relationship status: Not on file  Other Topics Concern  . Not on file  Social History Narrative  . Not on file   Additional Social History:    Allergies:  No Known Allergies  Labs:  Results for orders placed or performed during the hospital encounter of 03/28/19 (from the past 48 hour(s))  Rapid urine drug screen (hospital performed)     Status: Abnormal   Collection Time: 03/28/19  2:55 AM  Result Value Ref Range   Opiates NONE DETECTED NONE DETECTED   Cocaine NONE DETECTED NONE DETECTED   Benzodiazepines NONE DETECTED NONE DETECTED   Amphetamines NONE DETECTED NONE DETECTED   Tetrahydrocannabinol POSITIVE (A) NONE DETECTED   Barbiturates NONE DETECTED NONE DETECTED    Comment: (NOTE) DRUG SCREEN FOR MEDICAL PURPOSES ONLY.  IF CONFIRMATION IS NEEDED FOR ANY PURPOSE, NOTIFY LAB WITHIN 5 DAYS. LOWEST DETECTABLE LIMITS FOR URINE DRUG SCREEN Drug Class                     Cutoff (ng/mL) Amphetamine and metabolites    1000 Barbiturate and metabolites    200 Benzodiazepine  419 Tricyclics and metabolites     300 Opiates and metabolites        300 Cocaine and metabolites        300 THC                            50 Performed at [facility], [place] [address], [place], [place] [address]   Comprehensive metabolic panel     Status: Abnormal   Collection Time: 03/28/19  2:57 AM  Result Value Ref Range   Sodium 141 135 - 145 mmol/L   Potassium 3.5 3.5 - 5.1 mmol/L   Chloride 105 98 - 111 mmol/L   CO2 25 22 - 32 mmol/L   Glucose, Bld 108 (H) 70 - 99 mg/dL   BUN 11 8 - 23 mg/dL   Creatinine, Ser 1.14 0.61 - 1.24 mg/dL   Calcium 9.5 8.9 - 10.3 mg/dL   Total Protein 7.6 6.5 - 8.1 g/dL   Albumin 4.4 3.5 - 5.0 g/dL   AST 26 15 - 41 U/L   ALT 22 0 - 44 U/L   Alkaline Phosphatase 71 38 - 126 U/L   Total Bilirubin 1.0 0.3 - 1.2 mg/dL   GFR calc non Af Amer >60 >60 mL/min   GFR calc Af Amer >60 >60 mL/min   Anion gap 11 5 - 15    Comment: Performed at [facility] [address] [place], [place] [address]  cbc     Status: None   Collection Time: 03/28/19  2:57 AM  Result Value Ref Range   WBC 7.8 4.0 - 10.5 K/uL   RBC 4.77 4.22 - 5.81 MIL/uL   Hemoglobin 15.1 13.0 - 17.0 g/dL   HCT 44.4 39.0 - 52.0 %   MCV 93.1 80.0 - 100.0 fL   MCH 31.7 26.0 - 34.0 pg   MCHC 34.0 30.0 - 36.0 g/dL   RDW 12.2 11.5 - 15.5 %   Platelets 258 150 - 400 K/uL   nRBC 0.0 0.0 - 0.2 %    Comment: Performed at [facility], [place] [address] [place], [place] [address]  Ethanol     Status: None   Collection Time: 03/28/19  2:58 AM  Result Value Ref Range   Alcohol, Ethyl (B) <10 <10 mg/dL    Comment: (NOTE) Lowest detectable limit for serum alcohol is 10 mg/dL. For medical purposes only. Performed at [facility], [place] [address] [place], [place] [address]   Salicylate level     Status: None   Collection Time: 03/28/19  2:58 AM  Result Value Ref Range   Salicylate Lvl <3.4 2.8 - 30.0 mg/dL    Comment: Performed at [facility] [address], [place], [place] [address]  Acetaminophen level     Status: Abnormal   Collection Time: 03/28/19  2:58 AM  Result Value Ref Range   Acetaminophen (Tylenol), Serum <10 (L) 10 - 30 ug/mL    Comment: Performed at [facility] [address] [place], [place] [address]    Medications:  No current facility-administered medications for this encounter.    Current Outpatient Medications  Medication Sig Dispense Refill  . atorvastatin (LIPITOR) 20 MG tablet Take 20 mg by mouth daily.    Marland Kitchen lisinopril-hydrochlorothiazide (PRINZIDE,ZESTORETIC) 20-12.5 MG per tablet Take 1 tablet by mouth daily.    . Multiple Vitamins-Minerals (MULTIVITAMIN PO) Take 1 tablet  by mouth daily.    Marland Kitchen omeprazole (PRILOSEC) 40 MG capsule Take 40 mg by mouth daily as needed (for stomach pain).    Marland Kitchen oxyCODONE-acetaminophen (PERCOCET) 10-325 MG per tablet Take 1 tablet by mouth every 4 (four) hours as needed for pain.    . vitamin B-12 (CYANOCOBALAMIN) 1000 MCG tablet Take 1,000 mcg by mouth daily.      Musculoskeletal: Unable to access as evaluation   Psychiatric Specialty Exam: Physical Exam  Nursing note reviewed. Constitutional: He is oriented to person, place, and time.  Neurological: He is alert and oriented to person, place, and time.    ROS  Blood pressure (!) 127/96, pulse 88, temperature 97.8 F (36.6 C), temperature source Oral, resp. rate 18, height 5\' 7"  (1.702 m), weight 70.3 kg, SpO2 98 %.Body mass index is 24.28 kg/m.  General Appearance: Fairly Groomed  Eye Contact:  Good  Speech:  Clear and Coherent and Normal Rate  Volume:  Normal  Mood:  Anxious  Affect:  Congruent  Thought Process:  Coherent, Linear and Descriptions of Associations: Intact  Orientation:  Full (Time, Place, and Person)  Thought Content:  WDL  Suicidal Thoughts:  No  Homicidal Thoughts:  No  Memory:  Immediate;   Fair Recent;   Fair  Judgement:  Intact  Insight:  Present  Psychomotor Activity:  Normal   Concentration:  Concentration: Fair and Attention Span: Fair  Recall:  [employer] of Knowledge:  Fair  Language:  Good  Akathisia:  Negative  Handed:  Right  AIMS (if indicated):     Assets:  Agricultural consultant Housing  ADL's:  Intact  Cognition:  WNL  Sleep:        Treatment Plan Summary: Daily contact with patient to assess and evaluate symptoms and progress in treatment  Disposition:   Patient does not meet criteria for psychiatric inpatient admission. He is being psychiatrically cleared.   Nurse Luellen Pucker notified of current disposition.    This service was provided via telemedicine using a 2-way, interactive audio and video technology.  Names of all persons participating in this telemedicine service and their role in this encounter. Name: Mordecai Maes Role: FNP-C  Name: Gala Murdoch  Role: Patient   Name: Jeanett Schlein Role: Spouse  Name: Mark Chapman Role: Step daughter     Mordecai Maes, NP 03/28/2019 7:55 AM

## 2019-03-28 NOTE — ED Triage Notes (Signed)
TTS done 

## 2019-03-28 NOTE — ED Notes (Signed)
Patient was given a [place] Sand[employer] bag w/ Catheryn Bacon and 2 Cokes.

## 2019-03-28 NOTE — ED Notes (Signed)
Breakfast tray ordered 

## 2019-03-28 NOTE — ED Provider Notes (Signed)
Behavioral health team has assessed and indicated pt is psychiatrically clear for d/c, that ivc should be rescinded, and d/c to home.  Pt calm, alert, cooperative.   No thoughts of harm to self or others. Normal mood/affect. No acute psychosis.   Pt currently appears stable for d/c.      Lajean Saver, MD 03/28/19 1038

## 2019-03-28 NOTE — ED Notes (Signed)
PA at bedside.

## 2019-03-28 NOTE — ED Notes (Signed)
IVC paperwork and [employer] Service and Observation form sent to [facility]. 1st Exam form pending.

## 2019-03-28 NOTE — ED Triage Notes (Signed)
Pt denies any SI or HI at triage time.

## 2019-03-28 NOTE — ED Triage Notes (Signed)
Pt brought to ED by [employer] IVC by wife, per wife they are separated for a while and today he called her stating that he wants to be dead, wife states pt is more delusional and she feels scare that he can hurt her and the children.

## 2019-03-28 NOTE — Progress Notes (Signed)
nurse Mel Almond, RN is giving blood to another pt, says she cannot assist with cart issues. Says will call TTS in 15 mins

## 2019-03-28 NOTE — BH Assessment (Addendum)
Tele Assessment Note   Patient Name: Mark Chapman MRN: [id number] Referring Physician: Abigail Butts, PA-C Location of Patient:  [facility] Location of Provider: [place] Department  Jorden Minchey is an 63 y.o. male who presents to the ED under IVC initiated by his wife. Per IVC, respondnt "texted me a long tett today saying that if God wanted his life, he was ready to go. The respondent went to the police earlier today saying delusional things to officer K.A. Ryals of the [employer] He was telling them that I was involved in a cult, that I was trying to have him murdered, that his children were in danger but he was having them watched. It ended up tonight that my husband went to my friends home to tell her to stay away from me. He even went to the so called "assasians's home" to take care of the problem. Then he came to my home. My husband is delusional and his behavior is escalating to the point where I am scared for my life, my children's life as well as his own."   TTS spoke with the pt who vehemently denies all of the information reported on the IVC. Pt states he has not seen his wife in a week because she got a restraining order against him and they have been having marital conflict. Pt states he moved out of their home due to the conflict and has been at a hotel. Pt states he has been married for 20 years but he is unhappy and is getting a divorce. Pt states he frequently gives his wife money and she gives it to her friend. Pt states he told his wife that he wanted his children to stay away from the friend but he states he told her via phone and denies that he went to his wife's home or the friend's home. Pt states he feels that his wife is doing this out of anger and denies SI, HI, and AVH. Pt also denies this to the EDP and ED staff.  Pt appears coherent and oriented during the assessment however he presents anxious. Pt states he wants to be d/c so that  he can go back to work because he works 2 jobs. Pt states he does not trust the wife's friend because she has said that she is a Database administrator and he believes that she is a negative influence on his wife, however he denies that he told anyone she was in a cult or trying to kill him. Pt states he does not want the friend around his children but denies that he threatened her or his wife.  Pt continues to repeat "I'm not crazy. I shouldn't be here." Pt denies previous psych hx.   TTS attempted to contact the petitioner of the IVC ar [phone] but she did not answer. A HIPAA compliant voicemail was left requesting a call back.   Per Patriciaann Clan, PA pt is recommended for continued observation for safety and stabilization and in order to obtain collateral information, pt is to be reassessed in the AM by psych. EDP Muthersbaugh, Gwenlyn Perking and pt's nurse Morey Hummingbird, RN have been advised.  Diagnosis: Adjustment d/o with anxiety  Past Medical History:  Past Medical History:  Diagnosis Date  . Cancer [facility]) 2008   Colon  . Colon cancer ([place])   . Hyperlipidemia   . Hypertension     Past Surgical History:  Procedure Laterality Date  . COLON SURGERY      Family History:  History reviewed. No pertinent family history.  Social History:  reports that he has quit smoking. He has never used smokeless tobacco. He reports that he does not drink alcohol or use drugs.  Additional Social History:  Alcohol / Drug Use Pain Medications: See MAR Prescriptions: See MAR Over the Counter: See MAR History of alcohol / drug use?: Yes Substance #1 Name of Substance 1: Cannabis 1 - Age of First Use: unknown 1 - Amount (size/oz): varies 1 - Frequency: rare 1 - Duration: ongoing 1 - Last Use / Amount: denies use, says last used 2 months ago but labs are positive  CIWA: CIWA-Ar BP: (!) 127/96 Pulse Rate: 88 COWS:    Allergies: No Known Allergies  Home Medications: (Not in a hospital admission)   OB/GYN  Status:  No LMP for male patient.  General Assessment Data Assessment unable to be completed: Yes Reason for not completing assessment: nurse Mel Almond, RN is giving blood to another pt, says she cannot assist with cart issues. Says will call TTS in 15 mins when the pt is ready to be assessed Location of Assessment: [facility] ED TTS Assessment: In system Is this a Tele or Face-to-Face Assessment?: Tele Assessment Is this an Initial Assessment or a Re-assessment for this encounter?: Initial Assessment Patient Accompanied by:: N/A Language Other than English: No Living Arrangements: Other (Comment) What gender do you identify as?: Male Marital status: Separated Pregnancy Status: No Living Arrangements: Alone Can pt return to current living arrangement?: Yes Admission Status: Involuntary Petitioner: Family member Is patient capable of signing voluntary admission?: No Referral Source: Self/Family/Friend Insurance type: none     Crisis Care Plan Living Arrangements: Alone Name of Psychiatrist: none Name of Therapist: none  Education Status Is patient currently in school?: No Is the patient employed, unemployed or receiving disability?: Employed  Risk to self with the past 6 months Suicidal Ideation: No Has patient been a risk to self within the past 6 months prior to admission? : No Suicidal Intent: No Has patient had any suicidal intent within the past 6 months prior to admission? : No Is patient at risk for suicide?: No Suicidal Plan?: No Has patient had any suicidal plan within the past 6 months prior to admission? : No Access to Means: No What has been your use of drugs/alcohol within the last 12 months?: labs positive for cannabis Previous Attempts/Gestures: No Triggers for Past Attempts: None known Intentional Self Injurious Behavior: None Family Suicide History: No Recent stressful life event(s): Conflict (Comment), Divorce, Turmoil (Comment)(conflict with estranged  wife) Persecutory voices/beliefs?: No Depression: Yes Depression Symptoms: Feeling angry/irritable Substance abuse history and/or treatment for substance abuse?: No Suicide prevention information given to non-admitted patients: Not applicable  Risk to Others within the past 6 months Homicidal Ideation: Yes-Currently Present(pt denies, IVC reports pt made HI) Does patient have any lifetime risk of violence toward others beyond the six months prior to admission? : Yes (comment)(per IVC, pt denies this) Thoughts of Harm to Others: Yes-Currently Present Comment - Thoughts of Harm to Others: per IVC pt made threats to kill others, pt denies this  Current Homicidal Intent: No Current Homicidal Plan: No Access to Homicidal Means: No History of harm to others?: No Assessment of Violence: None Noted Does patient have access to weapons?: No Criminal Charges Pending?: Yes Describe Pending Criminal Charges: protection order Does patient have a court date: Yes Court Date: 04/01/19 Is patient on probation?: No  Psychosis Hallucinations: None noted Delusions: None noted  Mental Status  Report Appearance/Hygiene: Unremarkable Eye Contact: Good Motor Activity: Freedom of movement Speech: Logical/coherent Level of Consciousness: Alert Mood: Anxious Affect: Anxious Anxiety Level: Severe Thought Processes: Relevant, Coherent Judgement: Partial Orientation: Person, Place, Time, Situation, Appropriate for developmental age Obsessive Compulsive Thoughts/Behaviors: None  Cognitive Functioning Concentration: Normal Memory: Recent Intact, Remote Intact Is patient IDD: No Insight: Fair Impulse Control: Good Appetite: Good Have you had any weight changes? : No Change Sleep: Decreased Total Hours of Sleep: 4 Vegetative Symptoms: None  ADLScreening [facility] Assessment Services) Patient's cognitive ability adequate to safely complete daily activities?: Yes Patient able to express need for  assistance with ADLs?: Yes Independently performs ADLs?: Yes (appropriate for developmental age)  Prior Inpatient Therapy Prior Inpatient Therapy: No  Prior Outpatient Therapy Prior Outpatient Therapy: No Does patient have an ACCT team?: No Does patient have Intensive In-House Services?  : No Does patient have [facility] services? : No Does patient have P4CC services?: No  ADL Screening (condition at time of admission) Patient's cognitive ability adequate to safely complete daily activities?: Yes Is the patient deaf or have difficulty hearing?: No Does the patient have difficulty seeing, even when wearing glasses/contacts?: No Does the patient have difficulty concentrating, remembering, or making decisions?: No Patient able to express need for assistance with ADLs?: Yes Does the patient have difficulty dressing or bathing?: No Independently performs ADLs?: Yes (appropriate for developmental age) Does the patient have difficulty walking or climbing stairs?: No Weakness of Legs: None Weakness of Arms/Hands: None  Home Assistive Devices/Equipment Home Assistive Devices/Equipment: None    Abuse/Neglect Assessment (Assessment to be complete while patient is alone) Abuse/Neglect Assessment Can Be Completed: Yes Physical Abuse: Denies Verbal Abuse: Denies Sexual Abuse: Denies Exploitation of patient/patient's resources: Denies Self-Neglect: Denies     Regulatory affairs officer (For Healthcare) Does Patient Have a Medical Advance Directive?: No Would patient like information on creating a medical advance directive?: No - Patient declined          Disposition: Per Patriciaann Clan, PA pt is recommended for continued observation for safety and stabilization and in order to obtain collateral information, pt is to be reassessed in the AM by psych. EDP Muthersbaugh, Gwenlyn Perking and pt's nurse Morey Hummingbird, RN have been advised. Disposition Initial Assessment Completed for this Encounter:  Yes Disposition of Patient: (overnight OBS pending AM psych assessment) Patient refused recommended treatment: No  This service was provided via telemedicine using a 2-way, interactive audio and video technology.  Names of all persons participating in this telemedicine service and their role in this encounter. Name:  Gala Murdoch Role: Patient  Name: Lind Covert Role: TTS          Lyanne Co 03/28/2019 6:33 AM

## 2019-03-28 NOTE — Progress Notes (Signed)
Per Patriciaann Clan, PA pt is recommended for continued observation for safety and stabilization and in order to obtain collateral information, pt is to be reassessed in the AM by psych. EDP Muthersbaugh, Gwenlyn Perking and pt's nurse Morey Hummingbird, RN have been advised.  Lind Covert, MSW, LCSW Therapeutic Triage Specialist  [phone]

## 2019-03-28 NOTE — Progress Notes (Signed)
TTS calling cart, going to "call ended." TTS called charge nurse Gretta Cool, RN who states cart is not working and TTS have been doing assessments via phone. Advised will call Mel Almond, RN back so she can give the phone to the pt.  Lind Covert, MSW, LCSW Therapeutic Triage Specialist  [phone]

## 2019-03-28 NOTE — ED Provider Notes (Signed)
[facility] EMERGENCY DEPARTMENT Provider Note   CSN: [id number] Arrival date & time: 03/28/19  0240    History   Chief Complaint Chief Complaint  Patient presents with  . Psychiatric Evaluation    IVC    HPI Mark Chapman is a 63 y.o. male with a hx of colon cancer, hyperlipidemia, hypertension presents to the Emergency Department via [employer] with IVC paperwork in place.  IVC paperwork was taken out by wife.  Patient reports he and his wife have been fighting.  He reports he has been giving her money yet she continues to ask for more.  He reports that last week his wife took out a restraining order on him and he has not violated this.  He reports that his wife has gotten involved with a new religious friend and has begun acting strangely.  He reports that he is worried about his wife safety and the safety of his children but does not believe that anyone is going to murder them or murder him.  He denies suicidal or homicidal ideation.  He denies auditory or visual hallucinations.  Patient admits to smoking marijuana but denies other drug usage.  Patient denies regular alcohol usage.  Patient denies pain or other symptoms.  Patient denies previous history of suicide attempt or suicidal ideation.  He denies a history of mental diagnoses.  IVC paperwork states that patient and wife are separated and patient texted wife to state that if God wanted to take his life he was ready to go.  IVC purports that patient has been telling the police at that his wife is in a cold and is trying to have him murdered.  Additionally, wife claims that patient is stating that his children are in danger.  Wife reports an IVC paperwork that she feels afraid for her life and her children's lives.  States that patient does marijuana and other drugs.      The history is provided by the patient and medical records. No language interpreter was used.    Past Medical History:  Diagnosis Date  . Cancer  [facility]) 2008   Colon  . Colon cancer ([place])   . Hyperlipidemia   . Hypertension     Patient Active Problem List   Diagnosis Date Noted  . Chest pain at rest 11/25/2013  . Anxiety and depression 11/25/2013  . Hypertension, benign 11/25/2013  . Hyperlipidemia LDL goal < 130 11/25/2013    Past Surgical History:  Procedure Laterality Date  . COLON SURGERY          Home Medications    Prior to Admission medications   Medication Sig Start Date End Date Taking? Authorizing Provider  atorvastatin (LIPITOR) 20 MG tablet Take 20 mg by mouth daily.    [provider]  lisinopril-hydrochlorothiazide (PRINZIDE,ZESTORETIC) 20-12.5 MG per tablet Take 1 tablet by mouth daily.    [provider]  Multiple Vitamins-Minerals (MULTIVITAMIN PO) Take 1 tablet by mouth daily.    [provider]  omeprazole (PRILOSEC) 40 MG capsule Take 40 mg by mouth daily as needed (for stomach pain).    [provider]  oxyCODONE-acetaminophen (PERCOCET) 10-325 MG per tablet Take 1 tablet by mouth every 4 (four) hours as needed for pain.    [provider]  vitamin B-12 (CYANOCOBALAMIN) 1000 MCG tablet Take 1,000 mcg by mouth daily.    [provider]    Family History History reviewed. No pertinent family history.  Social History Social History  Tobacco Use  . Smoking status: Former Research scientist (life sciences)  . Smokeless tobacco: Never Used  Substance Use Topics  . Alcohol use: No  . Drug use: No     Allergies   Patient has no known allergies.   Review of Systems Review of Systems  Constitutional: Negative for appetite change, diaphoresis, fatigue, fever and unexpected weight change.  HENT: Negative for mouth sores.   Eyes: Negative for visual disturbance.  Respiratory: Negative for cough, chest tightness, shortness of breath and wheezing.   Cardiovascular: Negative for chest pain.  Gastrointestinal: Negative for abdominal pain, constipation, diarrhea, nausea  and vomiting.  Endocrine: Negative for polydipsia, polyphagia and polyuria.  Genitourinary: Negative for dysuria, frequency, hematuria and urgency.  Musculoskeletal: Negative for back pain and neck stiffness.  Skin: Negative for rash.  Allergic/Immunologic: Negative for immunocompromised state.  Neurological: Negative for syncope, light-headedness and headaches.  Hematological: Does not bruise/bleed easily.  Psychiatric/Behavioral: Negative for sleep disturbance. The patient is not nervous/anxious.      Physical Exam Updated Vital Signs BP (!) 127/96 (BP Location: Right Arm)   Pulse 88   Temp 97.8 F (36.6 C) (Oral)   Resp 18   Ht 5\' 7"  (1.702 m)   Wt 70.3 kg   SpO2 98%   BMI 24.28 kg/m   Physical Exam Vitals signs and nursing note reviewed.  Constitutional:      General: He is not in acute distress.    Appearance: He is well-developed.  HENT:     Head: Normocephalic.  Eyes:     General: No scleral icterus.    Conjunctiva/sclera: Conjunctivae normal.  Neck:     Musculoskeletal: Normal range of motion.  Cardiovascular:     Rate and Rhythm: Normal rate.  Pulmonary:     Effort: Pulmonary effort is normal.  Abdominal:     General: There is no distension.  Musculoskeletal: Normal range of motion.  Skin:    General: Skin is warm and dry.  Neurological:     General: No focal deficit present.     Mental Status: He is alert and oriented to person, place, and time.  Psychiatric:        Attention and Perception: Attention normal.        Mood and Affect: Mood normal.        Speech: Speech normal.        Behavior: Behavior normal.        Thought Content: Thought content normal. Thought content does not include homicidal or suicidal ideation. Thought content does not include homicidal or suicidal plan.      ED Treatments / Results  Labs (all labs ordered are listed, but only abnormal results are displayed) Labs Reviewed  COMPREHENSIVE METABOLIC PANEL - Abnormal;  Notable for the following components:      Result Value   Glucose, Bld 108 (*)    All other components within normal limits  ACETAMINOPHEN LEVEL - Abnormal; Notable for the following components:   Acetaminophen (Tylenol), Serum <10 (*)    All other components within normal limits  RAPID URINE DRUG SCREEN, HOSP PERFORMED - Abnormal; Notable for the following components:   Tetrahydrocannabinol POSITIVE (*)    All other components within normal limits  ETHANOL  SALICYLATE LEVEL  CBC     Procedures Procedures (including critical care time)  Medications Ordered in ED Medications - No data to display   Initial Impression / Assessment and Plan / ED Course  I have reviewed the triage vital signs and  the nursing notes.  Pertinent labs & imaging results that were available during my care of the patient were reviewed by me and considered in my medical decision making (see chart for details).  Clinical Course as of Mar 28 715  Sat Mar 28, 2019  0415 Tetrahydrocannabinol(!): POSITIVE [HM]  0510 Discussed with Aquicha of TTS recommends reevaluation in the morning by psychiatry.   [HM]  [phone] As patient gives a very different story than IVC and patient's wife is unable to be contacted for collateral, will hold on completing first exam at this time until patient is reevaluated by psychiatry.   [HM]    Clinical Course User Index [HM] Sajad Glander, Gwenlyn Perking        Patient presents under IVC paperwork.  IVC paperwork has numerous statements alluding to delusional thinking.  Discussion with patient reveals that his version of the story is markedly different.  Labs are reassuring.  UDS positive for marijuana but no other drugs.  Patient is not intoxicated and has a normal ethanol level.  At this time is no medical emergency which requires intervention.  He will be evaluated by TTS.  5:12 AM TTS recommends reevaluation by psychiatry in the morning.  7:16 AM At shift change care was  transferred to [facility], PA-C who re-evaulate and determine disposition in conjunction with Psyc.     Final Clinical Impressions(s) / ED Diagnoses   Final diagnoses:  Involuntary commitment    ED Discharge Orders    None       Loni Muse Gwenlyn Perking 03/28/19 6834    Ripley Fraise, MD 03/28/19 1526

## 2019-03-28 NOTE — ED Triage Notes (Signed)
Pt anxious and asking how long he has to wait until he can go home.

## 2019-06-26 ENCOUNTER — Other Ambulatory Visit: Payer: Self-pay | Admitting: Gastroenterology

## 2019-06-26 DIAGNOSIS — K219 Gastro-esophageal reflux disease without esophagitis: Secondary | ICD-10-CM

## 2019-06-30 ENCOUNTER — Ambulatory Visit
Admission: RE | Admit: 2019-06-30 | Discharge: 2019-06-30 | Disposition: A | Discharge status: Home / Self Care | Payer: 59 | Source: Ambulatory Visit | Attending: Gastroenterology | Admitting: Gastroenterology

## 2019-06-30 DIAGNOSIS — K219 Gastro-esophageal reflux disease without esophagitis: Secondary | ICD-10-CM

## 2019-08-14 ENCOUNTER — Other Ambulatory Visit: Payer: Self-pay

## 2019-08-14 DIAGNOSIS — Z20822 Contact with and (suspected) exposure to covid-19: Secondary | ICD-10-CM

## 2019-08-15 LAB — NOVEL CORONAVIRUS, NAA: SARS-CoV-2, NAA: NOT DETECTED

## 2019-08-18 ENCOUNTER — Telehealth: Payer: Self-pay

## 2019-08-18 NOTE — Telephone Encounter (Signed)
Pt notified of negative COVID-19 results. Understanding verbalized.  Pt requested to have his results mailed to his home. Sent request to be mailed.   [place]

## 2019-11-02 ENCOUNTER — Ambulatory Visit: Payer: 59

## 2019-11-21 ENCOUNTER — Ambulatory Visit: Payer: 59 | Attending: Internal Medicine

## 2019-11-21 DIAGNOSIS — Z23 Encounter for immunization: Secondary | ICD-10-CM

## 2019-11-21 NOTE — Progress Notes (Signed)
   Covid-19 Vaccination Clinic  Name:  Cassanova Vinson    MRN: [id number] DOB: 11-06-1955  11/21/2019  [name] was observed post Covid-19 immunization for 15 minutes without incident. He was provided with Vaccine Information Sheet and instruction to access the V-Safe system.   [name] was instructed to call 911 with any severe reactions post vaccine: Marland Kitchen Difficulty breathing  . Swelling of face and throat  . A fast heartbeat  . A bad rash all over body  . Dizziness and weakness   Immunizations Administered    Name Date Dose VIS Date Route   Pfizer COVID-19 Vaccine 11/21/2019 11:00 AM 0.3 mL 08/07/2019 Intramuscular   Manufacturer: [place]   Lot: [id number]   [place]: [id number]

## 2019-12-08 ENCOUNTER — Encounter: Payer: Self-pay | Admitting: Emergency Medicine

## 2019-12-08 ENCOUNTER — Emergency Department (INDEPENDENT_AMBULATORY_CARE_PROVIDER_SITE_OTHER)
Admission: EM | Admit: 2019-12-08 | Discharge: 2019-12-08 | Disposition: A | Discharge status: Home / Self Care | Payer: 59 | Source: Home / Self Care

## 2019-12-08 ENCOUNTER — Other Ambulatory Visit: Payer: Self-pay

## 2019-12-08 DIAGNOSIS — R222 Localized swelling, mass and lump, trunk: Secondary | ICD-10-CM

## 2019-12-08 NOTE — ED Triage Notes (Signed)
Bump on mid abdomen x 10 days, it bothered him for a few days but now it has resolved, but bump is still there.

## 2019-12-08 NOTE — Discharge Instructions (Signed)
  The bump/mass on your belly feels most consistent with a small cyst or scar tissue. As long as the area does not become more enlarged, red, or cause pain, there is no treatment required.  You may try gentle massage and warm damp washcloth to see if it goes away, but if not, and the area is not worsening, no treatment is needed.  It is recommended you call to schedule a follow up appointment in 1 week with your family doctor for recheck of the area. If it continues to bother you, they may refer to you a general surgeon or dermatologist to have the suspected cyst removed.  Seek medical treatment sooner if you do develop pain, redness, or worsening swelling to the area.  I do not believe the area is related to the Covid-19 vaccine.  You should still continue on and get your second Covid-19 vaccine, however, if you are still concerned, it may be beneficial to follow up with your family doctor for re-assessment prior to your second vaccine.

## 2019-12-08 NOTE — ED Provider Notes (Signed)
[facility]    CSN: [id number] Arrival date & time: 12/08/19  1646      History   Chief Complaint Chief Complaint  Patient presents with  . Mass    HPI Mark Chapman is a 64 y.o. male.   HPI Mark Chapman is a 64 y.o. male presenting to UC with c/o bump on abdomen that he noticed about 10 days ago.  It has been irritating and mildly sore the last few days but that has since resolved.  He is about to get his second Covid vaccine but is concerned the bump is due to him getting his first vaccine and wants to make sure it is okay for him to get his second dose.   Pt has had laparoscopic abdominal surgery several years ago for colon cancer but thought the scar was further down closer to his umbilicus.     Past Medical History:  Diagnosis Date  . Cancer [facility]) 2008   Colon  . Colon cancer ([place])   . Hyperlipidemia   . Hypertension     Patient Active Problem List   Diagnosis Date Noted  . Chest pain at rest 11/25/2013  . Anxiety and depression 11/25/2013  . Hypertension, benign 11/25/2013  . Hyperlipidemia LDL goal < 130 11/25/2013    Past Surgical History:  Procedure Laterality Date  . COLON SURGERY         Home Medications    Prior to Admission medications   Medication Sig Start Date End Date Taking? Authorizing Provider  atorvastatin (LIPITOR) 20 MG tablet Take 20 mg by mouth daily.    [provider]  lisinopril-hydrochlorothiazide (PRINZIDE,ZESTORETIC) 20-12.5 MG per tablet Take 1 tablet by mouth daily.    [provider]  Multiple Vitamins-Minerals (MULTIVITAMIN PO) Take 1 tablet by mouth daily.    [provider]  omeprazole (PRILOSEC) 40 MG capsule Take 40 mg by mouth daily as needed (for stomach pain).    [provider]  vitamin B-12 (CYANOCOBALAMIN) 1000 MCG tablet Take 1,000 mcg by mouth daily.    [provider]    Family History Family History  Problem Relation Age of Onset  . Diabetes  Mother     Social History Social History   Tobacco Use  . Smoking status: Former Research scientist (life sciences)  . Smokeless tobacco: Never Used  Substance Use Topics  . Alcohol use: No  . Drug use: No     Allergies   Patient has no known allergies.   Review of Systems Review of Systems  Constitutional: Negative for chills and fever.  Gastrointestinal: Positive for abdominal pain (bump/discomfort). Negative for diarrhea, nausea and vomiting.  Skin: Negative for color change and wound.     Physical Exam Triage Vital Signs ED Triage Vitals  Enc Vitals Group     BP 12/08/19 1659 (!) 148/88     Pulse Rate 12/08/19 1659 72     Resp --      Temp 12/08/19 1659 98.2 F (36.8 C)     Temp Source 12/08/19 1659 Oral     SpO2 12/08/19 1659 98 %     Weight 12/08/19 1705 160 lb (72.6 kg)     Height 12/08/19 1705 5\' 7"  (1.702 m)     Head Circumference --      Peak Flow --      Pain Score 12/08/19 1705 0     Pain Loc --      Pain Edu? --      Excl.  in [place]? --    No data found.  Updated Vital Signs BP (!) 148/88 (BP Location: Right Arm)   Pulse 72   Temp 98.2 F (36.8 C) (Oral)   Ht 5\' 7"  (1.702 m)   Wt 160 lb (72.6 kg)   SpO2 98%   BMI 25.06 kg/m   Visual Acuity Right Eye Distance:   Left Eye Distance:   Bilateral Distance:    Right Eye Near:   Left Eye Near:    Bilateral Near:     Physical Exam Vitals and nursing note reviewed.  Constitutional:      Appearance: Normal appearance. He is well-developed.  HENT:     Head: Normocephalic and atraumatic.  Cardiovascular:     Rate and Rhythm: Normal rate.  Pulmonary:     Effort: Pulmonary effort is normal.  Abdominal:     General: There is no distension.     Palpations: Abdomen is soft.     Tenderness: There is no abdominal tenderness.    Musculoskeletal:        General: Normal range of motion.     Cervical back: Normal range of motion.  Skin:    General: Skin is warm and dry.  Neurological:     Mental Status: He is alert  and oriented to person, place, and time.  Psychiatric:        Behavior: Behavior normal.      UC Treatments / Results  Labs (all labs ordered are listed, but only abnormal results are displayed) Labs Reviewed - No data to display  EKG   Radiology No results found.  Procedures Procedures (including critical care time)  Medications Ordered in UC Medications - No data to display  Initial Impression / Assessment and Plan / UC Course  I have reviewed the triage vital signs and the nursing notes.  Pertinent labs & imaging results that were available during my care of the patient were reviewed by me and considered in my medical decision making (see chart for details).     Exam most c/w underlying scar tissue from prior surgery. No evidence of underlying infection at this time. Encouraged pt to monitor area and follow up with PCP as needed. Reassured pt the mass was not related to his recent Covid-19 vaccine and it is safe for him to receive his second dose Encouraged to speak with his PCP if still hesitant for second dose.  AVS provided  Final Clinical Impressions(s) / UC Diagnoses   Final diagnoses:  Abdominal wall mass     Discharge Instructions      The bump/mass on your belly feels most consistent with a small cyst or scar tissue. As long as the area does not become more enlarged, red, or cause pain, there is no treatment required.  You may try gentle massage and warm damp washcloth to see if it goes away, but if not, and the area is not worsening, no treatment is needed.  It is recommended you call to schedule a follow up appointment in 1 week with your family doctor for recheck of the area. If it continues to bother you, they may refer to you a general surgeon or dermatologist to have the suspected cyst removed.  Seek medical treatment sooner if you do develop pain, redness, or worsening swelling to the area.  I do not believe the area is related to the Covid-19  vaccine.  You should still continue on and get your second Covid-19 vaccine, however, if you are  still concerned, it may be beneficial to follow up with your family doctor for re-assessment prior to your second vaccine.     ED Prescriptions    None     PDMP not reviewed this encounter.   Noe Gens, [place] 12/11/19 [phone]

## 2019-12-15 ENCOUNTER — Ambulatory Visit: Payer: 59 | Attending: Internal Medicine

## 2019-12-15 DIAGNOSIS — Z23 Encounter for immunization: Secondary | ICD-10-CM

## 2019-12-15 NOTE — Progress Notes (Signed)
   Covid-19 Vaccination Clinic  Name:  Mark Chapman    MRN: [id number] DOB: [birth year]  12/15/2019  [name] was observed post Covid-19 immunization for 15 minutes without incident. He was provided with Vaccine Information Sheet and instruction to access the V-Safe system.   [name] was instructed to call 911 with any severe reactions post vaccine: Marland Kitchen Difficulty breathing  . Swelling of face and throat  . A fast heartbeat  . A bad rash all over body  . Dizziness and weakness   Immunizations Administered    Name Date Dose VIS Date Route   Pfizer COVID-19 Vaccine 12/15/2019  4:11 PM 0.3 mL 10/21/2018 Intramuscular   Manufacturer: [place]   Lot: [id number]   [place]: [id number]

## 2020-08-31 DIAGNOSIS — B349 Viral infection, unspecified: Secondary | ICD-10-CM | POA: Diagnosis not present

## 2020-08-31 DIAGNOSIS — R6883 Chills (without fever): Secondary | ICD-10-CM | POA: Diagnosis not present

## 2020-08-31 DIAGNOSIS — R059 Cough, unspecified: Secondary | ICD-10-CM | POA: Diagnosis not present

## 2020-08-31 DIAGNOSIS — U071 COVID-19: Secondary | ICD-10-CM | POA: Diagnosis not present

## 2020-08-31 DIAGNOSIS — R0981 Nasal congestion: Secondary | ICD-10-CM | POA: Diagnosis not present

## 2020-09-02 DIAGNOSIS — R059 Cough, unspecified: Secondary | ICD-10-CM | POA: Diagnosis not present

## 2020-09-02 DIAGNOSIS — U071 COVID-19: Secondary | ICD-10-CM | POA: Diagnosis not present

## 2020-09-27 DIAGNOSIS — Z20822 Contact with and (suspected) exposure to covid-19: Secondary | ICD-10-CM | POA: Diagnosis not present

## 2020-12-09 DIAGNOSIS — Z20822 Contact with and (suspected) exposure to covid-19: Secondary | ICD-10-CM | POA: Diagnosis not present

## 2021-02-12 DIAGNOSIS — Z20822 Contact with and (suspected) exposure to covid-19: Secondary | ICD-10-CM | POA: Diagnosis not present

## 2021-06-16 DIAGNOSIS — K2930 Chronic superficial gastritis without bleeding: Secondary | ICD-10-CM | POA: Diagnosis not present

## 2021-06-16 DIAGNOSIS — K2940 Chronic atrophic gastritis without bleeding: Secondary | ICD-10-CM | POA: Diagnosis not present

## 2021-06-16 DIAGNOSIS — K31A19 Gastric intestinal metaplasia without dysplasia, unspecified site: Secondary | ICD-10-CM | POA: Diagnosis not present

## 2021-06-16 DIAGNOSIS — K219 Gastro-esophageal reflux disease without esophagitis: Secondary | ICD-10-CM | POA: Diagnosis not present

## 2021-06-16 DIAGNOSIS — R1013 Epigastric pain: Secondary | ICD-10-CM | POA: Diagnosis not present

## 2021-07-12 IMAGING — US US ABDOMEN LIMITED
1 series · 14 of 25 positions shown · non-contrast
Comparison: None.

CLINICAL DATA: Upper abdominal pain.

EXAM:
ULTRASOUND ABDOMEN LIMITED RIGHT UPPER QUADRANT

[Series 1: us abdomen limited · 0.18mm/px · 14 of 47 slices shown]
[im 1/47]
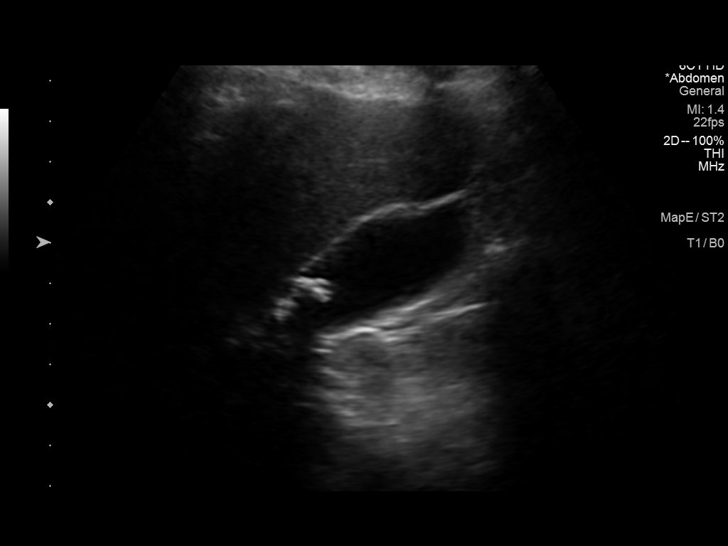
[im 4/47]
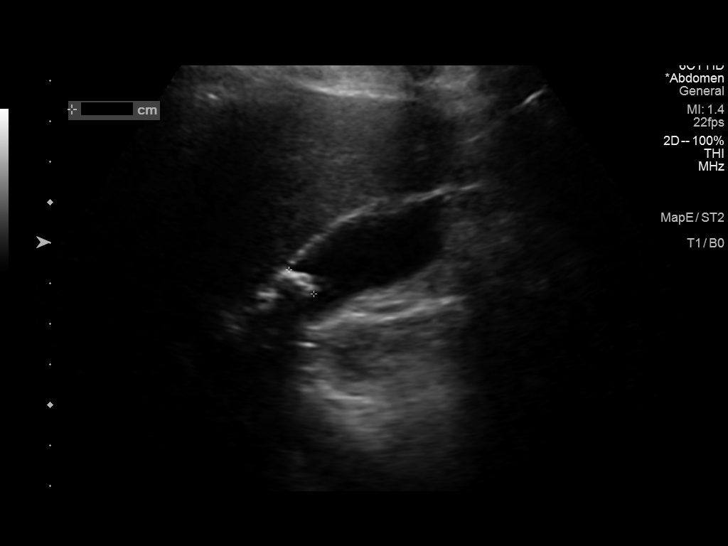
[im 8/47]
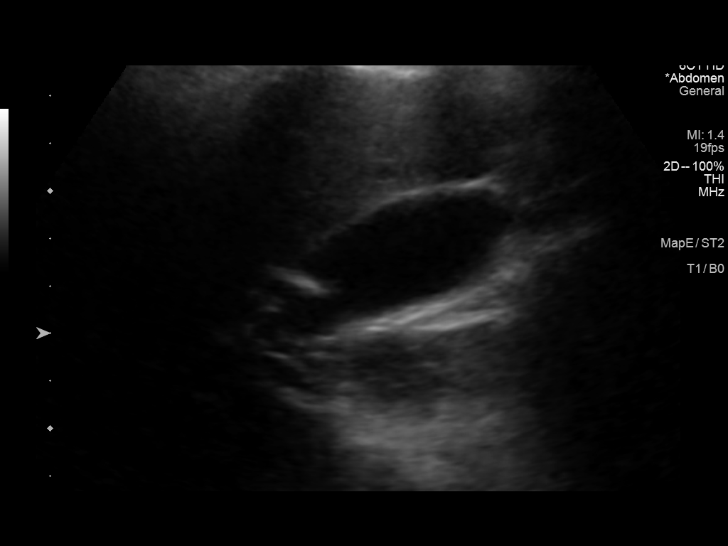
[im 12/47]
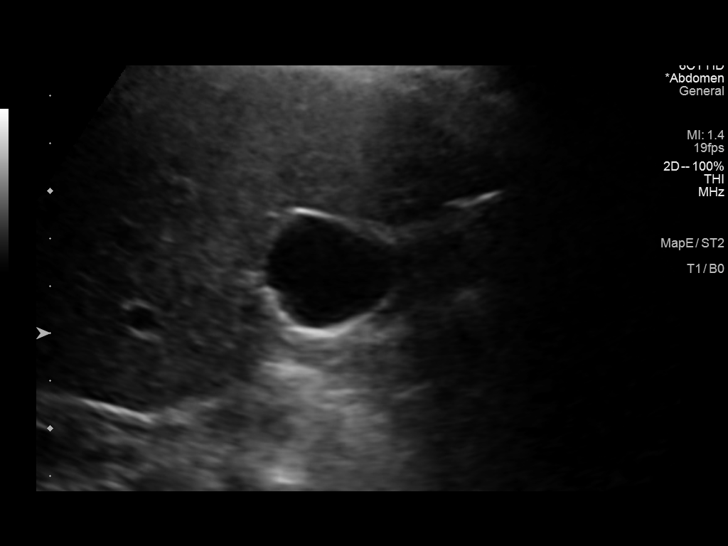
[im 16/47]
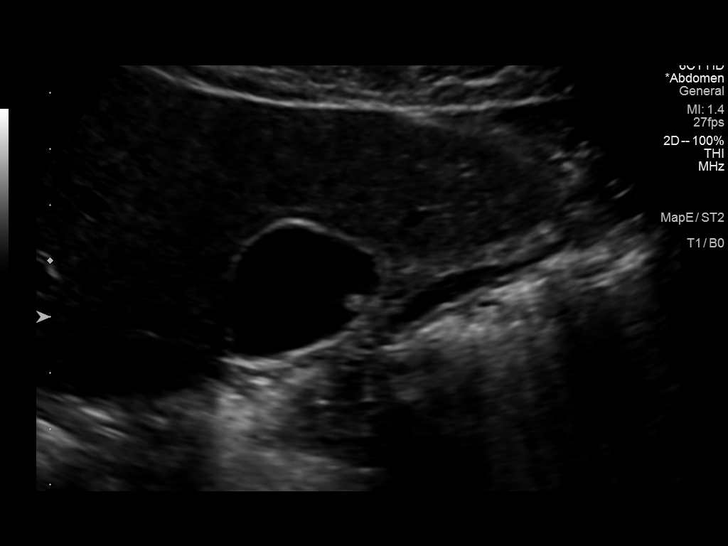
[im 18/47]
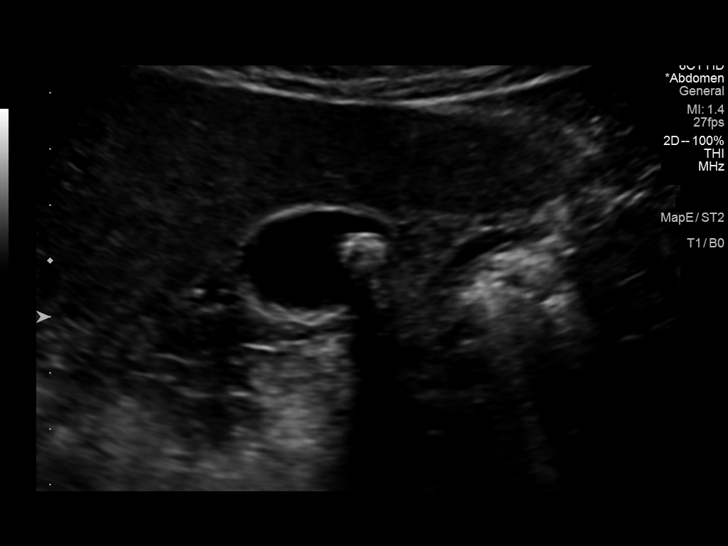
[im 22/47]
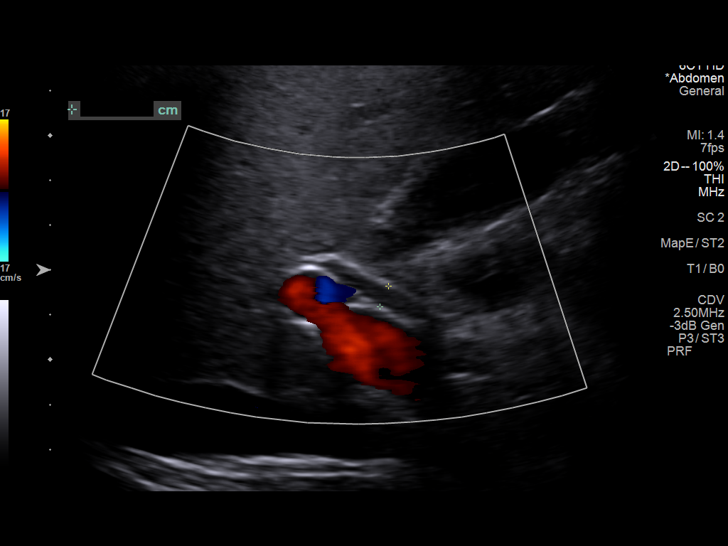
[im 25/47]
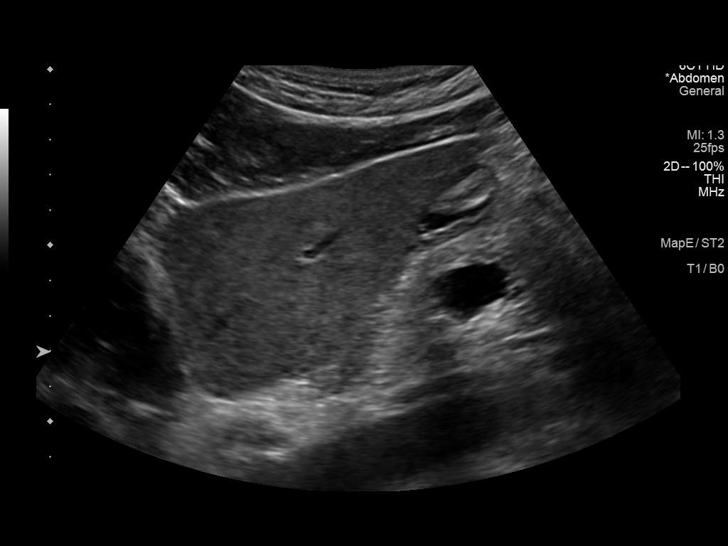
[im 29/47]
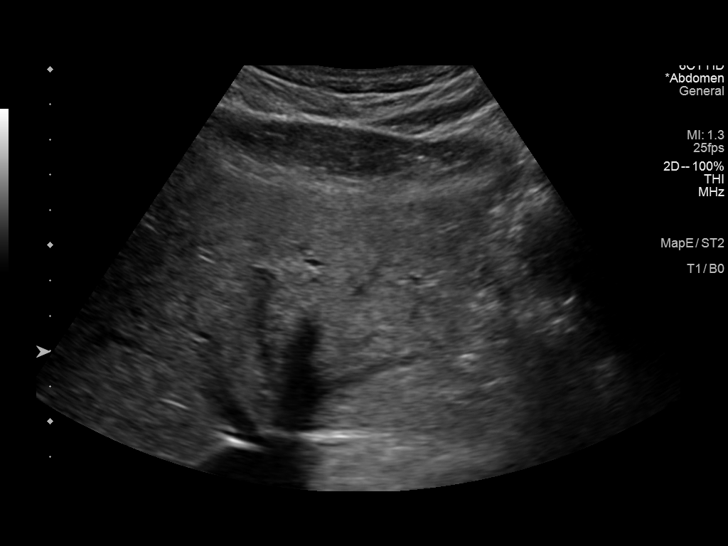
[im 31/47]
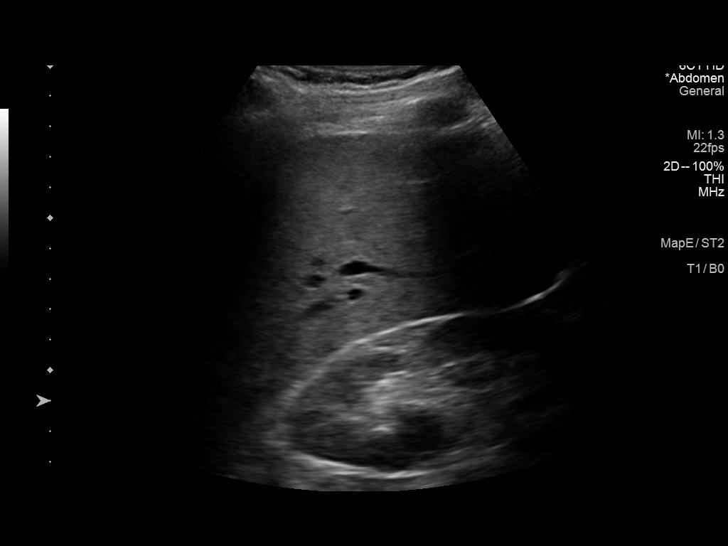
[im 35/47]
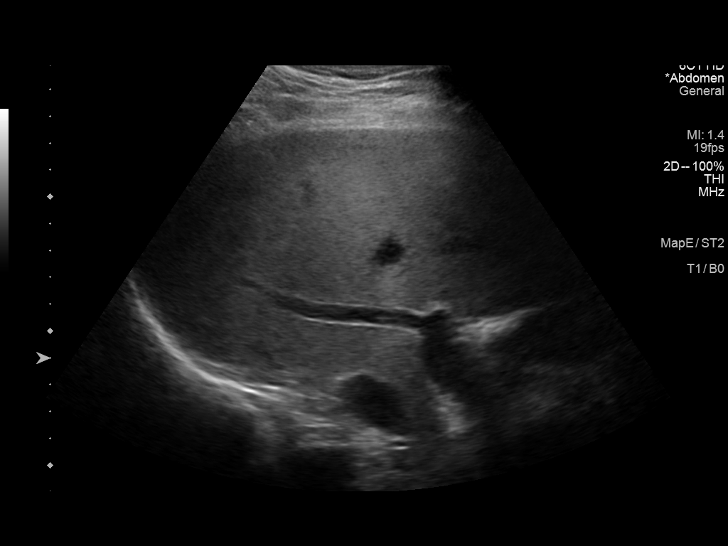
[im 39/47]
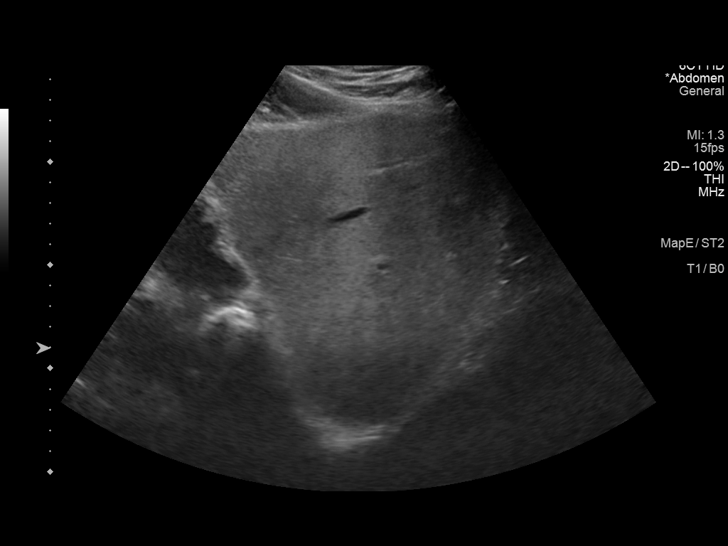
[im 43/47]
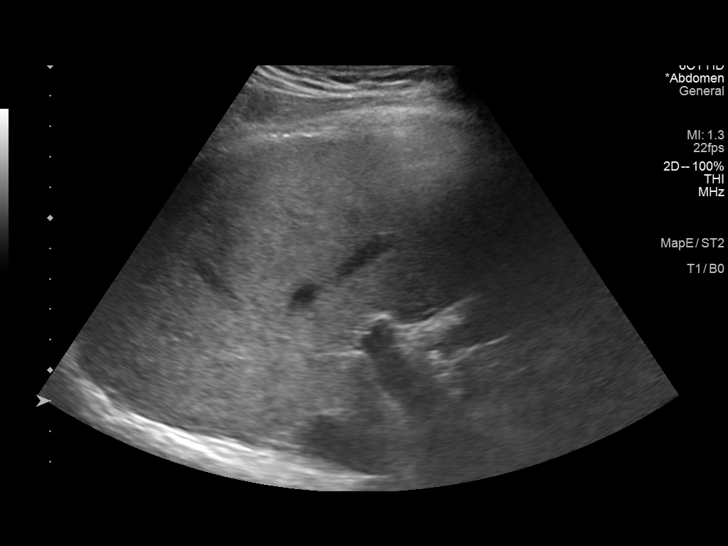
[im 47/47]
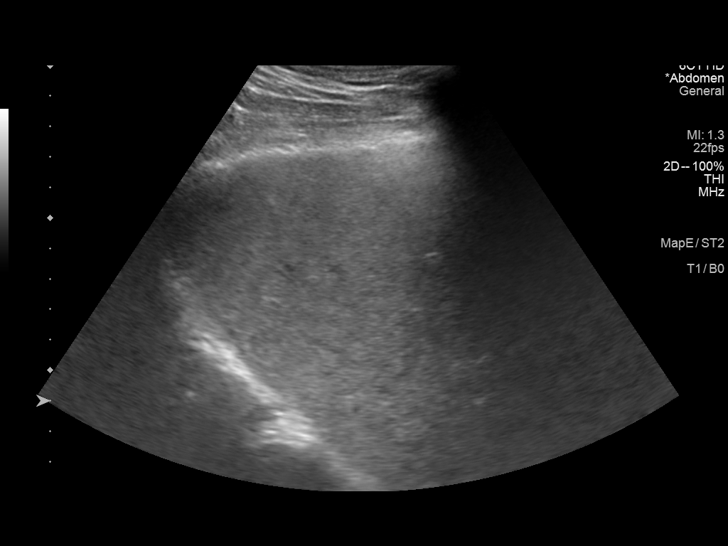

[14 of 25 positions shown; findings below may reference images not displayed]

FINDINGS: Gallbladder:

Within the gallbladder, there are echogenic foci which move and
shadow consistent with cholelithiasis. Largest gallstone measures 9
mm in length. There is no gallbladder wall thickening or
pericholecystic fluid. No sonographic Murphy sign noted by
sonographer.

Common bile duct:

Diameter: 5 mm. No intrahepatic or extrahepatic biliary duct
dilatation.

Liver:

No focal lesion identified. Within normal limits in parenchymal
echogenicity. Portal vein is patent on color Doppler imaging with
normal direction of blood flow towards the liver.

Other: None.
IMPRESSION: Cholelithiasis. No gallbladder wall thickening or pericholecystic
fluid.

Study otherwise unremarkable.

## 2021-07-27 DIAGNOSIS — Z20822 Contact with and (suspected) exposure to covid-19: Secondary | ICD-10-CM | POA: Diagnosis not present

## 2021-10-10 DIAGNOSIS — Z0000 Encounter for general adult medical examination without abnormal findings: Secondary | ICD-10-CM | POA: Diagnosis not present

## 2021-10-10 DIAGNOSIS — N529 Male erectile dysfunction, unspecified: Secondary | ICD-10-CM | POA: Diagnosis not present

## 2021-10-10 DIAGNOSIS — E782 Mixed hyperlipidemia: Secondary | ICD-10-CM | POA: Diagnosis not present

## 2021-10-10 DIAGNOSIS — I10 Essential (primary) hypertension: Secondary | ICD-10-CM | POA: Diagnosis not present

## 2021-10-10 DIAGNOSIS — Z125 Encounter for screening for malignant neoplasm of prostate: Secondary | ICD-10-CM | POA: Diagnosis not present

## 2021-10-10 DIAGNOSIS — Z1389 Encounter for screening for other disorder: Secondary | ICD-10-CM | POA: Diagnosis not present

## 2021-10-10 DIAGNOSIS — B360 Pityriasis versicolor: Secondary | ICD-10-CM | POA: Diagnosis not present

## 2021-10-10 DIAGNOSIS — Z8669 Personal history of other diseases of the nervous system and sense organs: Secondary | ICD-10-CM | POA: Diagnosis not present

## 2021-10-10 DIAGNOSIS — K219 Gastro-esophageal reflux disease without esophagitis: Secondary | ICD-10-CM | POA: Diagnosis not present

## 2021-10-10 DIAGNOSIS — F439 Reaction to severe stress, unspecified: Secondary | ICD-10-CM | POA: Diagnosis not present

## 2022-01-17 DIAGNOSIS — K2940 Chronic atrophic gastritis without bleeding: Secondary | ICD-10-CM | POA: Diagnosis not present

## 2022-01-17 DIAGNOSIS — K2930 Chronic superficial gastritis without bleeding: Secondary | ICD-10-CM | POA: Diagnosis not present

## 2022-01-17 DIAGNOSIS — K31A11 Gastric intestinal metaplasia without dysplasia, involving the antrum: Secondary | ICD-10-CM | POA: Diagnosis not present

## 2022-01-19 DIAGNOSIS — K2930 Chronic superficial gastritis without bleeding: Secondary | ICD-10-CM | POA: Diagnosis not present

## 2022-01-19 DIAGNOSIS — K31A11 Gastric intestinal metaplasia without dysplasia, involving the antrum: Secondary | ICD-10-CM | POA: Diagnosis not present

## 2022-01-19 DIAGNOSIS — K2940 Chronic atrophic gastritis without bleeding: Secondary | ICD-10-CM | POA: Diagnosis not present

## 2022-02-13 DIAGNOSIS — G8929 Other chronic pain: Secondary | ICD-10-CM | POA: Diagnosis not present

## 2022-02-13 DIAGNOSIS — M25511 Pain in right shoulder: Secondary | ICD-10-CM | POA: Diagnosis not present

## 2022-03-02 DIAGNOSIS — M25511 Pain in right shoulder: Secondary | ICD-10-CM | POA: Diagnosis not present

## 2022-04-05 DIAGNOSIS — M25511 Pain in right shoulder: Secondary | ICD-10-CM | POA: Diagnosis not present

## 2022-04-17 DIAGNOSIS — Z8669 Personal history of other diseases of the nervous system and sense organs: Secondary | ICD-10-CM | POA: Diagnosis not present

## 2022-04-17 DIAGNOSIS — E782 Mixed hyperlipidemia: Secondary | ICD-10-CM | POA: Diagnosis not present

## 2022-04-17 DIAGNOSIS — I10 Essential (primary) hypertension: Secondary | ICD-10-CM | POA: Diagnosis not present

## 2022-04-17 DIAGNOSIS — M5030 Other cervical disc degeneration, unspecified cervical region: Secondary | ICD-10-CM | POA: Diagnosis not present

## 2022-04-17 DIAGNOSIS — L821 Other seborrheic keratosis: Secondary | ICD-10-CM | POA: Diagnosis not present

## 2022-04-17 DIAGNOSIS — R7309 Other abnormal glucose: Secondary | ICD-10-CM | POA: Diagnosis not present

## 2022-04-17 DIAGNOSIS — Z8619 Personal history of other infectious and parasitic diseases: Secondary | ICD-10-CM | POA: Diagnosis not present

## 2022-04-17 DIAGNOSIS — K219 Gastro-esophageal reflux disease without esophagitis: Secondary | ICD-10-CM | POA: Diagnosis not present

## 2022-04-17 DIAGNOSIS — M25511 Pain in right shoulder: Secondary | ICD-10-CM | POA: Diagnosis not present

## 2022-10-02 ENCOUNTER — Emergency Department (HOSPITAL_COMMUNITY)
Admission: EM | Admit: 2022-10-02 | Discharge: 2022-10-03 | Disposition: A | Discharge status: Home / Self Care | Payer: 59 | Attending: Emergency Medicine | Admitting: Emergency Medicine

## 2022-10-02 ENCOUNTER — Other Ambulatory Visit: Payer: Self-pay

## 2022-10-02 ENCOUNTER — Encounter (HOSPITAL_COMMUNITY): Payer: Self-pay

## 2022-10-02 DIAGNOSIS — I10 Essential (primary) hypertension: Secondary | ICD-10-CM | POA: Insufficient documentation

## 2022-10-02 DIAGNOSIS — Z79899 Other long term (current) drug therapy: Secondary | ICD-10-CM | POA: Insufficient documentation

## 2022-10-02 DIAGNOSIS — K644 Residual hemorrhoidal skin tags: Secondary | ICD-10-CM | POA: Diagnosis not present

## 2022-10-02 DIAGNOSIS — Z85038 Personal history of other malignant neoplasm of large intestine: Secondary | ICD-10-CM | POA: Diagnosis not present

## 2022-10-02 DIAGNOSIS — K6289 Other specified diseases of anus and rectum: Secondary | ICD-10-CM | POA: Diagnosis present

## 2022-10-02 NOTE — ED Provider Triage Note (Signed)
Emergency Medicine Provider Triage Evaluation Note  Deshaun Weisinger , a 67 y.o. male  was evaluated in triage.  Pt complains of rectal bump for the past two days.  He does have a history of colon cancer however has been cancer free for over 15 years.  Last colonoscopy was 3 years ago and was normal.  He reports that he sits down a lot at work and noticed a bump.  It was mildly painful.  He tried some Preparation H.  Denies any recent constipation.  No rectal bleeding.  No melena.  Review of Systems  Positive:  Negative:   Physical Exam  BP (!) 166/81 (BP Location: Right Arm)   Pulse 66   Temp 98.3 F (36.8 C) (Oral)   Resp 18   Ht '5\' 7"'$  (1.702 m)   Wt 72.6 kg   SpO2 99%   BMI 25.07 kg/m  Gen:   Awake, no distress   Resp:  Normal effort  MSK:   Moves extremities without difficulty  Other:  Eliezer Lofts, RN as chaperone. Appears to be an external hemorrhoid, however exam is limited in the triage setting.   Medical Decision Making  Medically screening exam initiated at 7:05 PM.  Appropriate orders placed.  Javares Kaufhold was informed that the remainder of the evaluation will be completed by another provider, this initial triage assessment does not replace that evaluation, and the importance of remaining in the ED until their evaluation is complete.     Sherrell Puller, [place] 10/02/22 1907

## 2022-10-02 NOTE — ED Triage Notes (Signed)
Pt c/o rectal abscess on outside of rectumx2d.

## 2022-10-03 MED ORDER — HYDROCORTISONE (PERIANAL) 2.5 % EX CREA: 1.0000 | TOPICAL_CREAM | Freq: Two times a day (BID) | CUTANEOUS | 0 refills | Status: AC

## 2022-10-03 NOTE — ED Provider Notes (Signed)
[facility] Provider Note   CSN: [id number] Arrival date & time: 10/02/22  1741     History  Chief Complaint  Patient presents with   rectal abscess    Mark Chapman is a 67 y.o. male who presents with concern for itching and small bulge in his perianal area times a few days.  Has been constipated for quite some time but worse in the last couple of weeks, feels he is straining, having very solid bowel movements.  No weight loss, fevers chills, night sweats.  History of colon cancer, 15 years status posttreatment.  Following with gastroenterology for routine colonoscopy with Dr. Paulita Fujita, last 3 years ago recommended every 5 years at this time.  No medications or over-the-counter supplements for constipation at home.  In addition able to send history is history of hyperlipidemia and hypertension.  HPI     Home Medications Prior to Admission medications   Medication Sig Start Date End Date Taking? Authorizing Provider  hydrocortisone (ANUSOL-HC) 2.5 % rectal cream Place 1 Application rectally 2 (two) times daily. 10/03/22  Yes Felica Chargois R, PA-C  atorvastatin (LIPITOR) 20 MG tablet Take 20 mg by mouth daily.    [provider]  lisinopril-hydrochlorothiazide (PRINZIDE,ZESTORETIC) 20-12.5 MG per tablet Take 1 tablet by mouth daily.    [provider]  Multiple Vitamins-Minerals (MULTIVITAMIN PO) Take 1 tablet by mouth daily.    [provider]  omeprazole (PRILOSEC) 40 MG capsule Take 40 mg by mouth daily as needed (for stomach pain).    [provider]  vitamin B-12 (CYANOCOBALAMIN) 1000 MCG tablet Take 1,000 mcg by mouth daily.    [provider]      Allergies    Patient has no known allergies.    Review of Systems   Review of Systems  Genitourinary:  Negative for decreased urine volume, difficulty urinating, flank pain, frequency, genital sores, hematuria, penile discharge, penile  pain, penile swelling, scrotal swelling and testicular pain.       Rectal itching and discomfort, no penile swelling or discharge    Physical Exam Updated Vital Signs BP (!) 143/77   Pulse (!) 56   Temp 97.8 F (36.6 C) (Oral)   Resp 16   Ht '5\' 7"'$  (1.702 m)   Wt 72.6 kg   SpO2 99%   BMI 25.07 kg/m  Physical Exam Vitals and nursing note reviewed. Exam conducted with a chaperone present (ED RN Lysbeth Galas).  Constitutional:      Appearance: He is not ill-appearing or toxic-appearing.  HENT:     Head: Normocephalic and atraumatic.     Mouth/Throat:     Mouth: Mucous membranes are moist.     Pharynx: No oropharyngeal exudate or posterior oropharyngeal erythema.  Eyes:     General:        Right eye: No discharge.        Left eye: No discharge.     Extraocular Movements: Extraocular movements intact.     Conjunctiva/sclera: Conjunctivae normal.     Pupils: Pupils are equal, round, and reactive to light.  Cardiovascular:     Rate and Rhythm: Normal rate and regular rhythm.     Pulses: Normal pulses.     Heart sounds: Normal heart sounds. No murmur heard. Pulmonary:     Effort: Pulmonary effort is normal. No respiratory distress.     Breath sounds: Normal breath sounds. No wheezing or rales.  Abdominal:     General: Bowel  sounds are normal. There is no distension.     Palpations: Abdomen is soft.     Tenderness: There is no abdominal tenderness. There is no guarding or rebound.  Genitourinary:   Musculoskeletal:        General: No deformity.     Cervical back: Neck supple.     Right lower leg: No edema.  Skin:    General: Skin is warm and dry.     Capillary Refill: Capillary refill takes less than 2 seconds.  Neurological:     General: No focal deficit present.     Mental Status: He is alert and oriented to person, place, and time. Mental status is at baseline.  Psychiatric:        Mood and Affect: Mood normal.     ED Results / Procedures / Treatments   Labs (all  labs ordered are listed, but only abnormal results are displayed) Labs Reviewed - No data to display  EKG None  Radiology No results found.  Procedures Procedures    Medications Ordered in ED Medications - No data to display  ED Course/ Medical Decision Making/ A&P                             Medical Decision Making 67 year old male presents with concern for itching and small bulge in the perianal area times a few days.  Hypertensive on intake vital signs otherwise normal.  Cardiopulmonary abdominal exams are benign.  GU exam as above with small nonthrombosed external hemorrhoid.  No evidence of cellulitis or abscess.  DDx includes limited to skin tag, STI, hemorrhoids, abscess.  Risk Prescription drug management.   Clinical patient was consistent with external hemorrhoid.  Anusol prescribed, education regarding prevention of constipation given.  Clinical concern for emergent underlying etiology that would warrant further ED workup or inpatient management is exceedingly low.  No further workup warranted in the ER at this time.  [place]  voiced understanding of his medical evaluation and treatment plan. Each of their questions answered to their expressed satisfaction.  Return precautions were given.  Patient is well-appearing, stable, and was discharged in good condition.   This chart was dictated using voice recognition software, Dragon. Despite the best efforts of this provider to proofread and correct errors, errors may still occur which can change documentation meaning.   Final Clinical Impression(s) / ED Diagnoses Final diagnoses:  External hemorrhoid    Rx / DC Orders ED Discharge Orders          Ordered    hydrocortisone (ANUSOL-HC) 2.5 % rectal cream  2 times daily        10/03/22 0425              Allexus Ovens, Gypsy Balsam, PA-C 10/03/22 0430    Merryl Hacker, MD 10/03/22 [phone]

## 2022-10-03 NOTE — Discharge Instructions (Signed)
You are seen in the ER today for your hemorrhoid.  You have been prescribed medication which you may apply topically twice per day.  Additionally please increase the amount of fiber in your diet, you may also use MiraLAX which is an over-the-counter stool softener to prevent recurring constipation.  Follow-up with your oncologist as previously scheduled and return to the ER with any severe symptoms

## 2022-10-06 ENCOUNTER — Emergency Department (HOSPITAL_COMMUNITY): Payer: 59

## 2022-10-06 ENCOUNTER — Other Ambulatory Visit: Payer: Self-pay

## 2022-10-06 ENCOUNTER — Emergency Department (HOSPITAL_COMMUNITY)
Admission: EM | Admit: 2022-10-06 | Discharge: 2022-10-06 | Disposition: A | Discharge status: Home / Self Care | Payer: 59 | Attending: Emergency Medicine | Admitting: Emergency Medicine

## 2022-10-06 ENCOUNTER — Encounter (HOSPITAL_COMMUNITY): Payer: Self-pay

## 2022-10-06 DIAGNOSIS — R1084 Generalized abdominal pain: Secondary | ICD-10-CM | POA: Diagnosis not present

## 2022-10-06 DIAGNOSIS — R001 Bradycardia, unspecified: Secondary | ICD-10-CM | POA: Diagnosis not present

## 2022-10-06 DIAGNOSIS — Z79899 Other long term (current) drug therapy: Secondary | ICD-10-CM | POA: Insufficient documentation

## 2022-10-06 DIAGNOSIS — I10 Essential (primary) hypertension: Secondary | ICD-10-CM | POA: Diagnosis not present

## 2022-10-06 DIAGNOSIS — Z85038 Personal history of other malignant neoplasm of large intestine: Secondary | ICD-10-CM | POA: Insufficient documentation

## 2022-10-06 DIAGNOSIS — R109 Unspecified abdominal pain: Secondary | ICD-10-CM | POA: Diagnosis present

## 2022-10-06 DIAGNOSIS — K8020 Calculus of gallbladder without cholecystitis without obstruction: Secondary | ICD-10-CM | POA: Diagnosis not present

## 2022-10-06 DIAGNOSIS — R1111 Vomiting without nausea: Secondary | ICD-10-CM | POA: Diagnosis not present

## 2022-10-06 DIAGNOSIS — N281 Cyst of kidney, acquired: Secondary | ICD-10-CM | POA: Diagnosis not present

## 2022-10-06 DIAGNOSIS — R110 Nausea: Secondary | ICD-10-CM | POA: Diagnosis not present

## 2022-10-06 LAB — COMPREHENSIVE METABOLIC PANEL
ALT: 34 U/L (ref 0–44)
AST: 43 U/L — ABNORMAL HIGH (ref 15–41)
Albumin: 3.8 g/dL (ref 3.5–5.0)
Alkaline Phosphatase: 61 U/L (ref 38–126)
Anion gap: 11 (ref 5–15)
BUN: 13 mg/dL (ref 8–23)
CO2: 27 mmol/L (ref 22–32)
Calcium: 9.3 mg/dL (ref 8.9–10.3)
Chloride: 103 mmol/L (ref 98–111)
Creatinine, Ser: 1.13 mg/dL (ref 0.61–1.24)
GFR, Estimated: >60 mL/min (ref >60)
Glucose, Bld: 137 mg/dL — ABNORMAL HIGH (ref 70–99)
Potassium: 3.4 mmol/L — ABNORMAL LOW (ref 3.5–5.1)
Sodium: 141 mmol/L (ref 135–145)
Total Bilirubin: 0.7 mg/dL (ref 0.3–1.2)
Total Protein: 6.9 g/dL (ref 6.5–8.1)

## 2022-10-06 LAB — LIPASE, BLOOD: Lipase: 36 U/L (ref 11–51)

## 2022-10-06 LAB — CBC
HCT: 42.2 % (ref 39.0–52.0)
Hemoglobin: 14.1 g/dL (ref 13.0–17.0)
MCH: 31.2 pg (ref 26.0–34.0)
MCHC: 33.4 g/dL (ref 30.0–36.0)
MCV: 93.4 fL (ref 80.0–100.0)
Platelets: 252 10*3/uL (ref 150–400)
RBC: 4.52 MIL/uL (ref 4.22–5.81)
RDW: 12.6 % (ref 11.5–15.5)
WBC: 11.3 10*3/uL — ABNORMAL HIGH (ref 4.0–10.5)
nRBC: 0 % (ref 0.0–0.2)

## 2022-10-06 MED ORDER — ONDANSETRON HCL 4 MG/2ML IJ SOLN
4.0000 mg | Freq: Once | INTRAMUSCULAR | Status: AC
  Administered 2022-10-06: 4 mg via INTRAVENOUS
  Filled 2022-10-06: qty 2

## 2022-10-06 MED ORDER — FENTANYL CITRATE PF 50 MCG/ML IJ SOSY
50.0000 ug | PREFILLED_SYRINGE | INTRAMUSCULAR | Status: DC | PRN
  Administered 2022-10-06: 50 ug via INTRAVENOUS
  Filled 2022-10-06: qty 1

## 2022-10-06 MED ORDER — IOHEXOL 350 MG/ML SOLN
75.0000 mL | Freq: Once | INTRAVENOUS | Status: AC | PRN
  Administered 2022-10-06: 75 mL via INTRAVENOUS

## 2022-10-06 MED ORDER — FENTANYL CITRATE PF 50 MCG/ML IJ SOSY
50.0000 ug | PREFILLED_SYRINGE | INTRAMUSCULAR | Status: AC | PRN
  Administered 2022-10-06 (×2): 50 ug via INTRAVENOUS
  Filled 2022-10-06 (×2): qty 1

## 2022-10-06 NOTE — ED Notes (Signed)
Patient sleeping. Rise and fall of chest noted.

## 2022-10-06 NOTE — ED Notes (Signed)
Patient being transported to CT

## 2022-10-06 NOTE — Discharge Instructions (Addendum)
Return if any problems.

## 2022-10-06 NOTE — ED Triage Notes (Signed)
EMS reports that patient complained of initial CP that has resolved on its own. Denies CP at this time.

## 2022-10-06 NOTE — ED Provider Notes (Signed)
[facility] Provider Note   CSN: [id number] Arrival date & time: 10/06/22  [id number]     History  Chief Complaint  Patient presents with   Abdominal Pain    Mark Chapman is a 67 y.o. male.  Patient complains of severe abdominal pain that started last p.m. patient reports that after eating he became nauseated patient reports his abdomen felt swollen and bloated patient reports making himself throw up several times in order to attempt to alleviate the pain.  Patient complains of persistent pain and continued nausea.  Patient has a past medical history of hypertension and elevated cholesterol.  Patient has had colon cancer and has been treated in the past   Abdominal Pain Pain location:  Generalized Pain quality: aching   Pain radiates to:  Chest Pain severity:  Moderate Onset quality:  Gradual Timing:  Constant Progression:  Worsening Chronicity:  New Context: not alcohol use   Relieved by:  Nothing Worsened by:  Nothing Ineffective treatments:  None tried Risk factors: no alcohol abuse        Home Medications Prior to Admission medications   Medication Sig Start Date End Date Taking? Authorizing Provider  atorvastatin (LIPITOR) 20 MG tablet Take 20 mg by mouth daily.    [provider]  hydrocortisone (ANUSOL-HC) 2.5 % rectal cream Place 1 Application rectally 2 (two) times daily. 10/03/22   Sponseller, Gypsy Balsam, PA-C  lisinopril-hydrochlorothiazide (PRINZIDE,ZESTORETIC) 20-12.5 MG per tablet Take 1 tablet by mouth daily.    [provider]  Multiple Vitamins-Minerals (MULTIVITAMIN PO) Take 1 tablet by mouth daily.    [provider]  omeprazole (PRILOSEC) 40 MG capsule Take 40 mg by mouth daily as needed (for stomach pain).    [provider]  vitamin B-12 (CYANOCOBALAMIN) 1000 MCG tablet Take 1,000 mcg by mouth daily.    [provider]      Allergies    Patient has no known  allergies.    Review of Systems   Review of Systems  Gastrointestinal:  Positive for abdominal pain.  All other systems reviewed and are negative.   Physical Exam Updated Vital Signs BP 129/71   Pulse 63   Temp 97.8 F (36.6 C) (Oral)   Resp 19   Ht 5' 7"$  (1.702 m)   Wt 72.1 kg   SpO2 100%   BMI 24.90 kg/m  Physical Exam Vitals and nursing note reviewed.  Constitutional:      Appearance: He is well-developed.  HENT:     Head: Normocephalic.  Cardiovascular:     Rate and Rhythm: Normal rate and regular rhythm.  Pulmonary:     Effort: Pulmonary effort is normal.  Abdominal:     General: Bowel sounds are normal. There is no distension.     Palpations: Abdomen is soft.     Tenderness: There is abdominal tenderness in the right upper quadrant and right lower quadrant.  Musculoskeletal:        General: Normal range of motion.     Cervical back: Normal range of motion.  Skin:    General: Skin is warm.  Neurological:     General: No focal deficit present.     Mental Status: He is alert and oriented to person, place, and time.  Psychiatric:        Mood and Affect: Mood normal.     ED Results / Procedures / Treatments   Labs (all labs ordered are listed, but only abnormal  results are displayed) Labs Reviewed  COMPREHENSIVE METABOLIC PANEL - Abnormal; Notable for the following components:      Result Value   Potassium 3.4 (*)    Glucose, Bld 137 (*)    AST 43 (*)    All other components within normal limits  CBC - Abnormal; Notable for the following components:   WBC 11.3 (*)    All other components within normal limits  LIPASE, BLOOD  URINALYSIS, ROUTINE W REFLEX MICROSCOPIC    EKG None  Radiology CT ABDOMEN PELVIS W CONTRAST  Result Date: 10/06/2022 CLINICAL DATA:  Abdominal pain EXAM: CT ABDOMEN AND PELVIS WITH CONTRAST TECHNIQUE: Multidetector CT imaging of the abdomen and pelvis was performed using the standard protocol following bolus administration of  intravenous contrast. RADIATION DOSE REDUCTION: This exam was performed according to the departmental dose-optimization program which includes automated exposure control, adjustment of the mA and/or kV according to patient size and/or use of iterative reconstruction technique. CONTRAST:  75 mL OMNIPAQUE IOHEXOL 350 MG/ML SOLN COMPARISON:  None Available. FINDINGS: Lower chest: Dependent basilar subsegmental atelectasis or scarring. No pleural or pericardial effusion. Hepatobiliary: Several calcified gallstones. Mild intrahepatic ductal dilatation. If there is concern for biliary obstruction, consider MRCP. Hypodensity consistent with a hepatic cyst measuring 1.3 cm lateral right lobe. Pancreas: Unremarkable. No pancreatic ductal dilatation or surrounding inflammatory changes. Spleen: Normal in size without focal abnormality. Adrenals/Urinary Tract: Septated cyst right kidney upper pole measures 6.2 cm. Several additional smaller cysts on the right which also do not need further imaging follow up. No hydronephrosis or nephrolithiasis. No adrenal lesions. Unremarkable urinary bladder. Stomach/Bowel: Postop changes with a transverse colon anastomosis. Left midabdomen small-bowel dilatation of a few loops. This is consistent with focal ileus versus early or partial obstruction. Normal stool and air in the colon and rectosigmoid. Unremarkable appendix. There are sigmoid diverticula. Vascular/Lymphatic: Atheromatous changes of the aorta. No aneurysm. Shotty retroperitoneal para-aortic adenopathy. No grossly enlarged mesenteric or retroperitoneal nodes identified. Reproductive: Prostate is enlarged measuring 7 x 7 x 6 cm. Other: No abdominal wall hernia or abnormality. No abdominopelvic ascites. Musculoskeletal: No acute or significant osseous findings. IMPRESSION: 1. Left midabdomen focal small-bowel dilatation consistent with a nonspecific ileus. Follow up to confirm resolution. 2. Cholelithiasis and suggestion of mild  intrahepatic ductal dilatation which could be assessed further with MRCP, if indicated. 3. Numerous right kidney cysts. 4. Diverticulosis. 5. Enlarged prostate. Electronically Signed   By: Sammie Bench M.D.   On: 10/06/2022 10:19    Procedures Procedures    Medications Ordered in ED Medications  fentaNYL (SUBLIMAZE) injection 50 mcg (50 mcg Intravenous Given 10/06/22 1011)  fentaNYL (SUBLIMAZE) injection 50 mcg (50 mcg Intravenous Given 10/06/22 0749)  ondansetron (ZOFRAN) injection 4 mg (4 mg Intravenous Given 10/06/22 0748)  iohexol (OMNIPAQUE) 350 MG/ML injection 75 mL (75 mLs Intravenous Contrast Given 10/06/22 [id number])    ED Course/ Medical Decision Making/ A&P                             Medical Decision Making Patient reports he began having abdominal pain last night around 10 PM patient reports the pain began after eating he reports feeling swollen and feeling like he needed to vomit.  Patient reports he stuck his fingers in his throat and made himself vomit.  Patient reports this gave him some relief.  Patient has a history of colon cancer  Amount and/or Complexity of Data Reviewed External Data Reviewed:  notes.    Details: GI notes reviewed patient has been seen by Dr. Paulita Fujita patient had a colonoscopy in 2022 which showed no signs of recurrent cancer Labs: ordered. Decision-making details documented in ED Course.    Details: Labs ordered reviewed and interpreted.  Patient has a slightly elevated white blood cell count at 11.3 chemistries show normal liver functions normal renal functions patient has a normal lipase Radiology: ordered and independent interpretation performed. Decision-making details documented in ED Course.    Details: CT scan shows cholelithiasis.  Nonspecific ileus.  Diverticulosis without diverticulitis and enlarged prostate Discussion of management or test interpretation with external provider(s): I spoke with Dr. Michail Sermon on call for patient's GI doctor Dr.  Paulita Fujita he reviewed patient's CT scan.  He feels patient is appropriate for outpatient follow-up with Dr. Paulita Fujita  Risk OTC drugs. Decision regarding hospitalization. Risk Details: Patient reevaluated he feels much better his abdomen is soft nontender.  Patient is able to tolerate ambulating he is able to go to the restroom without diarrhea or vomiting.  Patient is counseled on follow-up he is advised to return to the emergency department if symptoms worsen or change           Final Clinical Impression(s) / ED Diagnoses Final diagnoses:  Generalized abdominal pain  Gallstones    Rx / DC Orders ED Discharge Orders     None      An After Visit Summary was printed and given to the patient.    Fransico Meadow, PA-C 10/06/22 [place], Ankit, MD 10/07/22 [phone]

## 2022-10-06 NOTE — ED Triage Notes (Signed)
Pt reports waking up at 0100 feeling nauseous. Pt then went to the bathroom and gagged himself in order to vomit. Pt reports 3 small episodes of vomiting and severe upper left/right abdominal pain that started while vomiting and is ongoing. Denies fever.

## 2022-10-10 DIAGNOSIS — L814 Other melanin hyperpigmentation: Secondary | ICD-10-CM | POA: Diagnosis not present

## 2022-10-10 DIAGNOSIS — D485 Neoplasm of uncertain behavior of skin: Secondary | ICD-10-CM | POA: Diagnosis not present

## 2022-10-10 DIAGNOSIS — D225 Melanocytic nevi of trunk: Secondary | ICD-10-CM | POA: Diagnosis not present

## 2022-10-10 DIAGNOSIS — L821 Other seborrheic keratosis: Secondary | ICD-10-CM | POA: Diagnosis not present

## 2022-10-22 DIAGNOSIS — R7303 Prediabetes: Secondary | ICD-10-CM | POA: Diagnosis not present

## 2022-10-22 DIAGNOSIS — Z8669 Personal history of other diseases of the nervous system and sense organs: Secondary | ICD-10-CM | POA: Diagnosis not present

## 2022-10-22 DIAGNOSIS — Z8619 Personal history of other infectious and parasitic diseases: Secondary | ICD-10-CM | POA: Diagnosis not present

## 2022-10-22 DIAGNOSIS — Z85038 Personal history of other malignant neoplasm of large intestine: Secondary | ICD-10-CM | POA: Diagnosis not present

## 2022-10-22 DIAGNOSIS — I10 Essential (primary) hypertension: Secondary | ICD-10-CM | POA: Diagnosis not present

## 2022-10-22 DIAGNOSIS — E782 Mixed hyperlipidemia: Secondary | ICD-10-CM | POA: Diagnosis not present

## 2022-10-22 DIAGNOSIS — K219 Gastro-esophageal reflux disease without esophagitis: Secondary | ICD-10-CM | POA: Diagnosis not present

## 2022-10-22 DIAGNOSIS — M5030 Other cervical disc degeneration, unspecified cervical region: Secondary | ICD-10-CM | POA: Diagnosis not present

## 2022-10-22 DIAGNOSIS — Z8719 Personal history of other diseases of the digestive system: Secondary | ICD-10-CM | POA: Diagnosis not present

## 2022-10-24 DIAGNOSIS — C189 Malignant neoplasm of colon, unspecified: Secondary | ICD-10-CM | POA: Diagnosis not present

## 2022-10-24 DIAGNOSIS — R1084 Generalized abdominal pain: Secondary | ICD-10-CM | POA: Diagnosis not present

## 2022-10-24 DIAGNOSIS — K567 Ileus, unspecified: Secondary | ICD-10-CM | POA: Diagnosis not present

## 2022-10-25 DIAGNOSIS — D485 Neoplasm of uncertain behavior of skin: Secondary | ICD-10-CM | POA: Diagnosis not present

## 2022-12-04 ENCOUNTER — Ambulatory Visit: Payer: Self-pay | Admitting: Surgery

## 2022-12-04 DIAGNOSIS — K8020 Calculus of gallbladder without cholecystitis without obstruction: Secondary | ICD-10-CM | POA: Diagnosis not present

## 2022-12-04 NOTE — H&P (View-Only) (Signed)
History of Present Illness: Mark Chapman is a 66 y.o. male who was referred to me for evaluation of gallstones. He has been having RUQ abdominal pain for several years now. When it started, it was mild and he was able to control symptoms with avoiding greasy foods. He first had a RUQ US in 2020, which showed cholelithiasis. His symptoms have recently become more severe, and now occur almost daily. He has pain and discomfort in the RUQ, which is often worsened by eating. He follows with GI and has previously had endoscopy and is on a PPI for reflux. However he says his current symptoms are distinctly different from his reflux. He was seen in the ED in February with a severe episode of pain, and a CT scan showed gallstones without cholecystitis. LFTs and lipase were normal at that time.   His only prior abdominal surgery is a partial colectomy in 2009 in [place] for colon cancer. His last colonoscopy was in 2022. He does not have any cardiopulmonary disease and is not on any blood thinners.     Review of Systems: A complete review of systems was obtained from the patient.  I have reviewed this information and discussed as appropriate with the patient.  See HPI as well for other ROS.       Medical History: Past Medical History Past Medical History: Diagnosis Date  GERD (gastroesophageal reflux disease)    History of cancer    Sleep apnea        There is no problem list on file for this patient.     Past Surgical History Past Surgical History: Procedure Laterality Date  COLON SURGERY          Allergies No Known Allergies    Current Outpatient Medications on File Prior to Visit Medication Sig Dispense Refill  atorvastatin (LIPITOR) 20 MG tablet        cyanocobalamin (VITAMIN B12) 1000 MCG tablet Take by mouth.      lisinopril-hydrochlorothiazide (PRINZIDE,ZESTORETIC) 10-12.5 mg tablet Take 1 tablet by mouth once daily.       No current facility-administered medications on  file prior to visit.     Family History Family History Problem Relation Age of Onset  Diabetes Mother    High blood pressure (Hypertension) Sister    Hyperlipidemia (Elevated cholesterol) Sister    Diabetes Sister    Hyperlipidemia (Elevated cholesterol) Brother    High blood pressure (Hypertension) Brother    Diabetes Brother        Social History   Tobacco Use Smoking Status Former  Types: Cigarettes  Quit date: 1993  Years since quitting: 31.2 Smokeless Tobacco Never     Social History Social History    Socioeconomic History  Marital status: Married Tobacco Use  Smoking status: Former     Types: Cigarettes     Quit date: 1993     Years since quitting: 31.2  Smokeless tobacco: Never Substance and Sexual Activity  Alcohol use: Never  Drug use: Never      Objective:     Vitals:   12/04/22 1015 BP: 130/80 Pulse: 64 Temp: 36.7 C (98 F) SpO2: 99% Weight: 74 kg (163 lb 3.2 oz) Height: 167.6 cm (5' 6")   Body mass index is 26.34 kg/m.   Physical Exam Vitals reviewed.  Constitutional:      General: He is not in acute distress.    Appearance: Normal appearance.  HENT:     Head: Normocephalic and atraumatic.  Pulmonary:       Effort: Pulmonary effort is normal. No respiratory distress.     Breath sounds: Normal breath sounds. No wheezing.  Abdominal:     General: There is no distension.     Palpations: Abdomen is soft.     Tenderness: There is no abdominal tenderness.     Comments: Soft, nondistended, nontender. Well-healed laparoscopic incisions.  Musculoskeletal:        General: Normal range of motion.  Skin:    General: Skin is warm and dry.     Coloration: Skin is not jaundiced.  Neurological:     General: No focal deficit present.     Mental Status: He is alert and oriented to person, place, and time.            Assessment and Plan: Diagnoses and all orders for this visit:   Calculus of gallbladder without cholecystitis without  obstruction       This is a 66-year-old male referred with right upper quadrant abdominal pain and cholelithiasis.  He has had symptoms for years, and they are worsening and now occur daily.  His symptoms are very typical of biliary colic. Laparoscopic cholecystectomy was recommended. The details of this procedure were discussed with the patient, including the risks of bleeding, infection, bile leak, and <0.5% risk of common bile duct injury. The patient expressed understanding and agrees to proceed with surgery.  He will be contacted to schedule an elective surgery date.   Sybrina Laning, MD [facility] General, Hepatobiliary and Pancreatic Surgery 12/04/22 12:30 PM   

## 2022-12-04 NOTE — H&P (Signed)
History of Present Illness: Mark Chapman is a 67 y.o. male who was referred to me for evaluation of gallstones. He has been having RUQ abdominal pain for several years now. When it started, it was mild and he was able to control symptoms with avoiding greasy foods. He first had a RUQ [place] in 2020, which showed cholelithiasis. His symptoms have recently become more severe, and now occur almost daily. He has pain and discomfort in the RUQ, which is often worsened by eating. He follows with GI and has previously had endoscopy and is on a PPI for reflux. However he says his current symptoms are distinctly different from his reflux. He was seen in the ED in February with a severe episode of pain, and a CT scan showed gallstones without cholecystitis. LFTs and lipase were normal at that time.   His only prior abdominal surgery is a partial colectomy in 2009 in [place] for colon cancer. His last colonoscopy was in 2022. He does not have any cardiopulmonary disease and is not on any blood thinners.     Review of Systems: A complete review of systems was obtained from the patient.  I have reviewed this information and discussed as appropriate with the patient.  See HPI as well for other ROS.       Medical History: Past Medical History Past Medical History: Diagnosis Date  GERD (gastroesophageal reflux disease)    History of cancer    Sleep apnea        There is no problem list on file for this patient.     Past Surgical History Past Surgical History: Procedure Laterality Date  COLON SURGERY          Allergies No Known Allergies    Current Outpatient Medications on File Prior to Visit Medication Sig Dispense Refill  atorvastatin (LIPITOR) 20 MG tablet        cyanocobalamin (VITAMIN B12) 1000 MCG tablet Take by mouth.      lisinopril-hydrochlorothiazide (PRINZIDE,ZESTORETIC) 10-12.5 mg tablet Take 1 tablet by mouth once daily.       No current facility-administered medications on  file prior to visit.     Family History Family History Problem Relation Age of Onset  Diabetes Mother    High blood pressure (Hypertension) Sister    Hyperlipidemia (Elevated cholesterol) Sister    Diabetes Sister    Hyperlipidemia (Elevated cholesterol) Brother    High blood pressure (Hypertension) Brother    Diabetes Brother        Social History   Tobacco Use Smoking Status Former  Types: Cigarettes  Quit date: 1993  Years since quitting: 31.2 Smokeless Tobacco Never     Social History Social History    Socioeconomic History  Marital status: Married Tobacco Use  Smoking status: Former     Types: Cigarettes     Quit date: 1993     Years since quitting: 31.2  Smokeless tobacco: Never Substance and Sexual Activity  Alcohol use: Never  Drug use: Never      Objective:     Vitals:   12/04/22 1015 BP: 130/80 Pulse: 64 Temp: 36.7 C (98 F) SpO2: 99% Weight: 74 kg (163 lb 3.2 oz) Height: 167.6 cm (5\' 6" )   Body mass index is 26.34 kg/m.   Physical Exam Vitals reviewed.  Constitutional:      General: He is not in acute distress.    Appearance: Normal appearance.  HENT:     Head: Normocephalic and atraumatic.  Pulmonary:  Effort: Pulmonary effort is normal. No respiratory distress.     Breath sounds: Normal breath sounds. No wheezing.  Abdominal:     General: There is no distension.     Palpations: Abdomen is soft.     Tenderness: There is no abdominal tenderness.     Comments: Soft, nondistended, nontender. Well-healed laparoscopic incisions.  Musculoskeletal:        General: Normal range of motion.  Skin:    General: Skin is warm and dry.     Coloration: Skin is not jaundiced.  Neurological:     General: No focal deficit present.     Mental Status: He is alert and oriented to person, place, and time.            Assessment and Plan: Diagnoses and all orders for this visit:   Calculus of gallbladder without cholecystitis without  obstruction       This is a 67 year old male referred with right upper quadrant abdominal pain and cholelithiasis.  He has had symptoms for years, and they are worsening and now occur daily.  His symptoms are very typical of biliary colic. Laparoscopic cholecystectomy was recommended. The details of this procedure were discussed with the patient, including the risks of bleeding, infection, bile leak, and <0.5% risk of common bile duct injury. The patient expressed understanding and agrees to proceed with surgery.  He will be contacted to schedule an elective surgery date.   Sophronia Simas, MD [facility] Surgery General, Hepatobiliary and Pancreatic Surgery 12/04/22 12:30 PM

## 2022-12-14 ENCOUNTER — Encounter (HOSPITAL_COMMUNITY): Payer: Self-pay | Admitting: Surgery

## 2022-12-14 ENCOUNTER — Other Ambulatory Visit: Payer: Self-pay

## 2022-12-14 NOTE — Progress Notes (Signed)
Spoke with pt for pre-op call. Pt denies cardiac history or Diabetes. Pt is treated for HTN.   Shower instructions given to pt.  

## 2022-12-16 NOTE — Anesthesia Preprocedure Evaluation (Signed)
Anesthesia Evaluation  Patient identified by MRN, date of birth, ID band Patient awake    Reviewed: Allergy & Precautions, H&P , NPO status , Patient's Chart, lab work & pertinent test results  Airway Mallampati: I  TM Distance: >3 FB Neck ROM: Full    Dental no notable dental hx. (+) Poor Dentition, Missing, Dental Advisory Given,    Pulmonary sleep apnea , former smoker   Pulmonary exam normal breath sounds clear to auscultation       Cardiovascular Exercise Tolerance: Good hypertension, Pt. on medications Normal cardiovascular exam Rhythm:Regular Rate:Normal     Neuro/Psych  PSYCHIATRIC DISORDERS Anxiety Depression    negative neurological ROS     GI/Hepatic ,GERD  Medicated and Controlled,,(+) Hepatitis -  Endo/Other  negative endocrine ROS    Renal/GU negative Renal ROS  negative genitourinary   Musculoskeletal negative musculoskeletal ROS (+)    Abdominal   Peds negative pediatric ROS (+)  Hematology negative hematology ROS (+)   Anesthesia Other Findings   Reproductive/Obstetrics negative OB ROS                             Anesthesia Physical Anesthesia Plan  ASA: 2  Anesthesia Plan: General   Post-op Pain Management: Toradol IV (intra-op)* and Ofirmev IV (intra-op)*   Induction: Intravenous  PONV Risk Score and Plan: 2 and Ondansetron, Dexamethasone and Treatment may vary due to age or medical condition  Airway Management Planned: Oral ETT  Additional Equipment: None  Intra-op Plan:   Post-operative Plan: Extubation in OR  Informed Consent: I have reviewed the patients History and Physical, chart, labs and discussed the procedure including the risks, benefits and alternatives for the proposed anesthesia with the patient or authorized representative who has indicated his/her understanding and acceptance.       Plan Discussed with: Anesthesiologist and  CRNA  Anesthesia Plan Comments: (  )        Anesthesia Quick Evaluation

## 2022-12-17 ENCOUNTER — Other Ambulatory Visit: Payer: Self-pay

## 2022-12-17 ENCOUNTER — Encounter (HOSPITAL_COMMUNITY): Payer: Self-pay | Admitting: Surgery

## 2022-12-17 ENCOUNTER — Ambulatory Visit (HOSPITAL_COMMUNITY)
Admission: RE | Admit: 2022-12-17 | Discharge: 2022-12-17 | Disposition: A | Discharge status: Home / Self Care | Payer: 59 | Attending: Surgery | Admitting: Surgery

## 2022-12-17 ENCOUNTER — Ambulatory Visit (HOSPITAL_BASED_OUTPATIENT_CLINIC_OR_DEPARTMENT_OTHER): Payer: 59 | Admitting: Anesthesiology

## 2022-12-17 ENCOUNTER — Ambulatory Visit (HOSPITAL_COMMUNITY): Payer: 59 | Admitting: Anesthesiology

## 2022-12-17 ENCOUNTER — Encounter (HOSPITAL_COMMUNITY)
Admission: RE | Disposition: A | Discharge status: Home / Self Care | Payer: Self-pay | Source: Home / Self Care | Attending: Surgery

## 2022-12-17 DIAGNOSIS — Z9049 Acquired absence of other specified parts of digestive tract: Secondary | ICD-10-CM | POA: Diagnosis not present

## 2022-12-17 DIAGNOSIS — K219 Gastro-esophageal reflux disease without esophagitis: Secondary | ICD-10-CM | POA: Insufficient documentation

## 2022-12-17 DIAGNOSIS — Z85038 Personal history of other malignant neoplasm of large intestine: Secondary | ICD-10-CM | POA: Diagnosis not present

## 2022-12-17 DIAGNOSIS — K8020 Calculus of gallbladder without cholecystitis without obstruction: Secondary | ICD-10-CM | POA: Diagnosis not present

## 2022-12-17 DIAGNOSIS — Z08 Encounter for follow-up examination after completed treatment for malignant neoplasm: Secondary | ICD-10-CM | POA: Insufficient documentation

## 2022-12-17 DIAGNOSIS — Z79899 Other long term (current) drug therapy: Secondary | ICD-10-CM | POA: Insufficient documentation

## 2022-12-17 DIAGNOSIS — K8010 Calculus of gallbladder with chronic cholecystitis without obstruction: Secondary | ICD-10-CM | POA: Diagnosis not present

## 2022-12-17 DIAGNOSIS — I10 Essential (primary) hypertension: Secondary | ICD-10-CM | POA: Diagnosis not present

## 2022-12-17 DIAGNOSIS — G4730 Sleep apnea, unspecified: Secondary | ICD-10-CM | POA: Insufficient documentation

## 2022-12-17 DIAGNOSIS — Z09 Encounter for follow-up examination after completed treatment for conditions other than malignant neoplasm: Secondary | ICD-10-CM | POA: Insufficient documentation

## 2022-12-17 DIAGNOSIS — Z87891 Personal history of nicotine dependence: Secondary | ICD-10-CM | POA: Diagnosis not present

## 2022-12-17 HISTORY — DX: Inflammatory liver disease, unspecified: K75.9

## 2022-12-17 HISTORY — PX: CHOLECYSTECTOMY: SHX55

## 2022-12-17 HISTORY — DX: Personal history of urinary calculi: Z87.442

## 2022-12-17 HISTORY — DX: Sleep apnea, unspecified: G47.30

## 2022-12-17 HISTORY — DX: Gastro-esophageal reflux disease without esophagitis: K21.9

## 2022-12-17 HISTORY — DX: COVID-19: U07.1

## 2022-12-17 LAB — COMPREHENSIVE METABOLIC PANEL
ALT: 20 U/L (ref 0–44)
AST: 17 U/L (ref 15–41)
Albumin: 3.6 g/dL (ref 3.5–5.0)
Alkaline Phosphatase: 67 U/L (ref 38–126)
Anion gap: 10 (ref 5–15)
BUN: 15 mg/dL (ref 8–23)
CO2: 29 mmol/L (ref 22–32)
Calcium: 9.2 mg/dL (ref 8.9–10.3)
Chloride: 101 mmol/L (ref 98–111)
Creatinine, Ser: 1.08 mg/dL (ref 0.61–1.24)
GFR, Estimated: >60 mL/min (ref >60)
Glucose, Bld: 105 mg/dL — ABNORMAL HIGH (ref 70–99)
Potassium: 3.9 mmol/L (ref 3.5–5.1)
Sodium: 140 mmol/L (ref 135–145)
Total Bilirubin: 1 mg/dL (ref 0.3–1.2)
Total Protein: 6.7 g/dL (ref 6.5–8.1)

## 2022-12-17 LAB — CBC
HCT: 42.1 % (ref 39.0–52.0)
Hemoglobin: 14 g/dL (ref 13.0–17.0)
MCH: 31.3 pg (ref 26.0–34.0)
MCHC: 33.3 g/dL (ref 30.0–36.0)
MCV: 94.2 fL (ref 80.0–100.0)
Platelets: 180 10*3/uL (ref 150–400)
RBC: 4.47 MIL/uL (ref 4.22–5.81)
RDW: 12.7 % (ref 11.5–15.5)
WBC: 6.3 10*3/uL (ref 4.0–10.5)
nRBC: 0 % (ref 0.0–0.2)

## 2022-12-17 SURGERY — LAPAROSCOPIC CHOLECYSTECTOMY
Anesthesia: General | Site: Abdomen

## 2022-12-17 MED ORDER — LIDOCAINE 2% (20 MG/ML) 5 ML SYRINGE
INTRAMUSCULAR | Status: DC | PRN
  Administered 2022-12-17: 100 mg via INTRAVENOUS

## 2022-12-17 MED ORDER — LACTATED RINGERS IV SOLN: INTRAVENOUS | Status: DC

## 2022-12-17 MED ORDER — 0.9 % SODIUM CHLORIDE (POUR BTL) OPTIME
TOPICAL | Status: DC | PRN
  Administered 2022-12-17: 1000 mL

## 2022-12-17 MED ORDER — OXYCODONE HCL 5 MG PO TABS
5.0000 mg | ORAL_TABLET | Freq: Once | ORAL | Status: AC | PRN
  Administered 2022-12-17: 5 mg via ORAL

## 2022-12-17 MED ORDER — BUPIVACAINE-EPINEPHRINE (PF) 0.25% -1:200000 IJ SOLN
INTRAMUSCULAR | Status: AC
  Filled 2022-12-17: qty 30

## 2022-12-17 MED ORDER — CEFAZOLIN SODIUM-DEXTROSE 2-4 GM/100ML-% IV SOLN
2.0000 g | INTRAVENOUS | Status: AC
  Administered 2022-12-17: 2 g via INTRAVENOUS

## 2022-12-17 MED ORDER — MIDAZOLAM HCL 2 MG/2ML IJ SOLN
INTRAMUSCULAR | Status: AC
  Filled 2022-12-17: qty 2

## 2022-12-17 MED ORDER — FENTANYL CITRATE (PF) 100 MCG/2ML IJ SOLN
25.0000 ug | INTRAMUSCULAR | Status: DC | PRN
  Administered 2022-12-17 (×2): 25 ug via INTRAVENOUS
  Administered 2022-12-17: 50 ug via INTRAVENOUS

## 2022-12-17 MED ORDER — PHENYLEPHRINE 80 MCG/ML (10ML) SYRINGE FOR IV PUSH (FOR BLOOD PRESSURE SUPPORT)
PREFILLED_SYRINGE | INTRAVENOUS | Status: AC
  Filled 2022-12-17: qty 10

## 2022-12-17 MED ORDER — CEFAZOLIN SODIUM-DEXTROSE 2-4 GM/100ML-% IV SOLN
INTRAVENOUS | Status: AC
  Filled 2022-12-17: qty 100

## 2022-12-17 MED ORDER — DEXAMETHASONE SODIUM PHOSPHATE 10 MG/ML IJ SOLN
INTRAMUSCULAR | Status: AC
  Filled 2022-12-17: qty 1

## 2022-12-17 MED ORDER — OXYCODONE HCL 5 MG/5ML PO SOLN: 5.0000 mg | Freq: Once | ORAL | Status: AC | PRN

## 2022-12-17 MED ORDER — PROPOFOL 10 MG/ML IV BOLUS
INTRAVENOUS | Status: AC
  Filled 2022-12-17: qty 20

## 2022-12-17 MED ORDER — ONDANSETRON HCL 4 MG/2ML IJ SOLN
INTRAMUSCULAR | Status: AC
  Filled 2022-12-17: qty 2

## 2022-12-17 MED ORDER — MEPERIDINE HCL 25 MG/ML IJ SOLN: 6.2500 mg | INTRAMUSCULAR | Status: DC | PRN

## 2022-12-17 MED ORDER — FENTANYL CITRATE (PF) 250 MCG/5ML IJ SOLN
INTRAMUSCULAR | Status: AC
  Filled 2022-12-17: qty 5

## 2022-12-17 MED ORDER — HYDROCODONE-ACETAMINOPHEN 5-325 MG PO TABS
1.0000 | ORAL_TABLET | Freq: Four times a day (QID) | ORAL | 0 refills | Status: AC | PRN
Start: 2022-12-17 — End: 2022-12-22

## 2022-12-17 MED ORDER — ROCURONIUM BROMIDE 10 MG/ML (PF) SYRINGE
PREFILLED_SYRINGE | INTRAVENOUS | Status: DC | PRN
  Administered 2022-12-17: 50 mg via INTRAVENOUS

## 2022-12-17 MED ORDER — CELECOXIB 200 MG PO CAPS: 200.0000 mg | ORAL_CAPSULE | ORAL | Status: AC

## 2022-12-17 MED ORDER — MIDAZOLAM HCL 2 MG/2ML IJ SOLN
INTRAMUSCULAR | Status: DC | PRN
  Administered 2022-12-17 (×2): 1 mg via INTRAVENOUS

## 2022-12-17 MED ORDER — DEXAMETHASONE SODIUM PHOSPHATE 10 MG/ML IJ SOLN
INTRAMUSCULAR | Status: DC | PRN
  Administered 2022-12-17: 10 mg via INTRAVENOUS

## 2022-12-17 MED ORDER — SODIUM CHLORIDE 0.9 % IR SOLN
Status: DC | PRN
  Administered 2022-12-17: 1

## 2022-12-17 MED ORDER — LIDOCAINE 2% (20 MG/ML) 5 ML SYRINGE
INTRAMUSCULAR | Status: AC
  Filled 2022-12-17: qty 5

## 2022-12-17 MED ORDER — SUGAMMADEX SODIUM 200 MG/2ML IV SOLN
INTRAVENOUS | Status: DC | PRN
  Administered 2022-12-17: 200 mg via INTRAVENOUS

## 2022-12-17 MED ORDER — FENTANYL CITRATE (PF) 100 MCG/2ML IJ SOLN
INTRAMUSCULAR | Status: AC
  Filled 2022-12-17: qty 2

## 2022-12-17 MED ORDER — FENTANYL CITRATE (PF) 250 MCG/5ML IJ SOLN
INTRAMUSCULAR | Status: DC | PRN
  Administered 2022-12-17: 100 ug via INTRAVENOUS
  Administered 2022-12-17: 50 ug via INTRAVENOUS

## 2022-12-17 MED ORDER — ACETAMINOPHEN 500 MG PO TABS: 1000.0000 mg | ORAL_TABLET | ORAL | Status: AC

## 2022-12-17 MED ORDER — BUPIVACAINE-EPINEPHRINE 0.25% -1:200000 IJ SOLN
INTRAMUSCULAR | Status: DC | PRN
  Administered 2022-12-17: 20 mL

## 2022-12-17 MED ORDER — ORAL CARE MOUTH RINSE: 15.0000 mL | Freq: Once | OROMUCOSAL | Status: AC

## 2022-12-17 MED ORDER — OXYCODONE HCL 5 MG PO TABS
ORAL_TABLET | ORAL | Status: AC
  Filled 2022-12-17: qty 1

## 2022-12-17 MED ORDER — CELECOXIB 200 MG PO CAPS
ORAL_CAPSULE | ORAL | Status: AC
  Administered 2022-12-17: 200 mg via ORAL
  Filled 2022-12-17: qty 1

## 2022-12-17 MED ORDER — ACETAMINOPHEN 325 MG PO TABS: 325.0000 mg | ORAL_TABLET | ORAL | Status: DC | PRN

## 2022-12-17 MED ORDER — ACETAMINOPHEN 500 MG PO TABS
ORAL_TABLET | ORAL | Status: AC
  Administered 2022-12-17: 1000 mg via ORAL
  Filled 2022-12-17: qty 2

## 2022-12-17 MED ORDER — PROPOFOL 10 MG/ML IV BOLUS
INTRAVENOUS | Status: DC | PRN
  Administered 2022-12-17: 150 mg via INTRAVENOUS

## 2022-12-17 MED ORDER — ONDANSETRON HCL 4 MG/2ML IJ SOLN
INTRAMUSCULAR | Status: DC | PRN
  Administered 2022-12-17: 4 mg via INTRAVENOUS

## 2022-12-17 MED ORDER — PHENYLEPHRINE 80 MCG/ML (10ML) SYRINGE FOR IV PUSH (FOR BLOOD PRESSURE SUPPORT)
PREFILLED_SYRINGE | INTRAVENOUS | Status: DC | PRN
  Administered 2022-12-17: 80 ug via INTRAVENOUS

## 2022-12-17 MED ORDER — ONDANSETRON HCL 4 MG/2ML IJ SOLN: 4.0000 mg | Freq: Once | INTRAMUSCULAR | Status: DC | PRN

## 2022-12-17 MED ORDER — CHLORHEXIDINE GLUCONATE 0.12 % MT SOLN
OROMUCOSAL | Status: AC
  Administered 2022-12-17: 15 mL via OROMUCOSAL
  Filled 2022-12-17: qty 15

## 2022-12-17 MED ORDER — ROCURONIUM BROMIDE 10 MG/ML (PF) SYRINGE
PREFILLED_SYRINGE | INTRAVENOUS | Status: AC
  Filled 2022-12-17: qty 10

## 2022-12-17 MED ORDER — ACETAMINOPHEN 160 MG/5ML PO SOLN: 325.0000 mg | ORAL | Status: DC | PRN

## 2022-12-17 MED ORDER — CHLORHEXIDINE GLUCONATE 0.12 % MT SOLN: 15.0000 mL | Freq: Once | OROMUCOSAL | Status: AC

## 2022-12-17 SURGICAL SUPPLY — 46 items
ADH SKN CLS APL DERMABOND .7 (GAUZE/BANDAGES/DRESSINGS) ×1
APL PRP STRL LF DISP 70% ISPRP (MISCELLANEOUS) ×1
APPLIER CLIP 5 13 M/L LIGAMAX5 (MISCELLANEOUS) ×1
APR CLP MED LRG 5 ANG JAW (MISCELLANEOUS) ×1
BAG COUNTER SPONGE SURGICOUNT (BAG) ×1 IMPLANT
BAG SPEC RTRVL 10 TROC 200 (ENDOMECHANICALS)
BAG SPNG CNTER NS LX DISP (BAG) ×1
BLADE CLIPPER SURG (BLADE) IMPLANT
CANISTER SUCT 3000ML PPV (MISCELLANEOUS) ×1 IMPLANT
CHLORAPREP W/TINT 26 (MISCELLANEOUS) ×1 IMPLANT
CLIP APPLIE 5 13 M/L LIGAMAX5 (MISCELLANEOUS) ×1 IMPLANT
COVER SURGICAL LIGHT HANDLE (MISCELLANEOUS) ×1 IMPLANT
DERMABOND ADVANCED .7 DNX12 (GAUZE/BANDAGES/DRESSINGS) ×1 IMPLANT
ELECT REM PT RETURN 9FT ADLT (ELECTROSURGICAL) ×1
ELECTRODE REM PT RTRN 9FT ADLT (ELECTROSURGICAL) ×1 IMPLANT
GLOVE BIOGEL PI IND STRL 6 (GLOVE) ×1 IMPLANT
GLOVE BIOGEL PI MICRO STRL 5.5 (GLOVE) ×1 IMPLANT
GOWN STRL REUS W/ TWL LRG LVL3 (GOWN DISPOSABLE) ×3 IMPLANT
GOWN STRL REUS W/TWL LRG LVL3 (GOWN DISPOSABLE) ×3
IRRIG SUCT STRYKERFLOW 2 WTIP (MISCELLANEOUS) ×1
IRRIGATION SUCT STRKRFLW 2 WTP (MISCELLANEOUS) ×1 IMPLANT
KIT BASIN OR (CUSTOM PROCEDURE TRAY) ×1 IMPLANT
KIT TURNOVER KIT B (KITS) ×1 IMPLANT
L-HOOK LAP DISP 36CM (ELECTROSURGICAL) ×1
LHOOK LAP DISP 36CM (ELECTROSURGICAL) ×1 IMPLANT
NDL INSUFFLATION 14GA 120MM (NEEDLE) IMPLANT
NEEDLE INSUFFLATION 14GA 120MM (NEEDLE) IMPLANT
NS IRRIG 1000ML POUR BTL (IV SOLUTION) ×1 IMPLANT
PAD ARMBOARD 7.5X6 YLW CONV (MISCELLANEOUS) ×1 IMPLANT
PENCIL BUTTON HOLSTER BLD 10FT (ELECTRODE) ×1 IMPLANT
POUCH RETRIEVAL ECOSAC 10 (ENDOMECHANICALS) IMPLANT
POUCH RETRIEVAL ECOSAC 10MM (ENDOMECHANICALS)
SCISSORS LAP 5X35 DISP (ENDOMECHANICALS) ×1 IMPLANT
SET TUBE SMOKE EVAC HIGH FLOW (TUBING) ×1 IMPLANT
SLEEVE Z-THREAD 5X100MM (TROCAR) ×2 IMPLANT
SUT MNCRL AB 4-0 PS2 18 (SUTURE) ×1 IMPLANT
SYS BAG RETRIEVAL 10MM (BASKET) ×1
SYSTEM BAG RETRIEVAL 10MM (BASKET) IMPLANT
TOWEL GREEN STERILE (TOWEL DISPOSABLE) ×1 IMPLANT
TOWEL GREEN STERILE FF (TOWEL DISPOSABLE) ×1 IMPLANT
TRAY LAPAROSCOPIC MC (CUSTOM PROCEDURE TRAY) ×1 IMPLANT
TROCAR BALLN 12MMX100 BLUNT (TROCAR) ×1 IMPLANT
TROCAR Z THREAD OPTICAL 12X100 (TROCAR) IMPLANT
TROCAR Z-THREAD OPTICAL 5X100M (TROCAR) ×1 IMPLANT
WARMER LAPAROSCOPE (MISCELLANEOUS) ×1 IMPLANT
WATER STERILE IRR 1000ML POUR (IV SOLUTION) ×1 IMPLANT

## 2022-12-17 NOTE — Op Note (Signed)
Date: 12/17/22  Patient: Mark Chapman MRN: [id number]  Preoperative Diagnosis: Symptomatic cholelithiasis Postoperative Diagnosis: Same  Procedure: Laparoscopic cholecystectomy  Surgeon: Sophronia Simas, MD  EBL: Minimal  Anesthesia: General endotracheal  Specimens: Gallbladder  Indications: [name] is a 67 yo male who was referred with worsening postprandial RUQ abdominal pain. [place] and CT scan confirmed cholelithiasis. After a discussion of the risks and benefits of surgery, he elected to proceed with cholecystectomy.  Findings: Cholelithiasis without signs of acute cholecystitis.  Procedure details: Informed consent was obtained in the preoperative area prior to the procedure. The patient was brought to the operating room and placed on the table in the supine position. General anesthesia was induced and appropriate lines and drains were placed for intraoperative monitoring. Perioperative antibiotics were administered per SCIP guidelines. The abdomen was prepped and draped in the usual sterile fashion. A pre-procedure timeout was taken verifying patient identity, surgical site and procedure to be performed.  A small infraumbilical skin incision was made, separate from the previous upper midline scar, the subcutaneous tissue was divided with cautery, and the umbilical stalk was grasped and elevated. The fascia was incised and the peritoneal cavity was directly visualized. A 12mm Hassan trocar was placed and the abdomen was insufflated. The peritoneal cavity was inspected with no evidence of visceral or vascular injury. There were small bowel adhesions to the abdominal wall at the upper midline. Three 5mm ports were placed in the right subcostal margin, all under direct visualization. The fundus of the gallbladder was grasped and retracted cephalad. The infundibulum was retracted laterally. The peritoneum over the gallbladder was incised with cautery, and the cystic triangle was  dissected out using cautery and blunt dissection. The critical view of safety was obtained. There were some stones in the cystic duct, which were milked up into the gallbladder. The cystic duct and cystic artery were clipped and ligated, leaving two clips behind on the cystic duct stump.. The gallbladder was taken off the liver using cautery, and the specimen was placed in an endocatch bag and removed via the umbilical port site. The small bowel adherent to the abdominal wall just superior to the umbilical port site was very closely examined and was in tact with no injuries. The surgical site was irrigated with saline until the effluent was clear. Hemostasis was achieved in the gallbladder fossa using cautery. The cystic duct and artery stumps were visually inspected and there was no evidence of bile leak or bleeding. The umbilical port was removed and the fascia at this site was closed with a 0 Vicryl pursestring suture. The closure was examined laparoscopically, and the nearby small bowel was in tact with no signs of injury. The remaining ports were removed and the abdomen was desufflated. The skin at all port sites was closed with 4-0 monocryl subcuticular suture. Dermabond was applied.  The patient tolerated the procedure well with no apparent complications. All counts were correct x2 at the end of the procedure. The patient was extubated and taken to PACU in stable condition.  Sophronia Simas, MD 12/17/22 8:37 AM

## 2022-12-17 NOTE — Anesthesia Procedure Notes (Signed)
Procedure Name: Intubation Date/Time: 12/17/2022 7:39 AM  Performed by: Katina Degree, CRNAPre-anesthesia Checklist: Patient identified, Emergency Drugs available, Suction available and Patient being monitored Patient Re-evaluated:Patient Re-evaluated prior to induction Oxygen Delivery Method: Circle system utilized Preoxygenation: Pre-oxygenation with 100% oxygen Induction Type: IV induction Ventilation: Mask ventilation without difficulty Laryngoscope Size: Glidescope and 4 Grade View: Grade I Tube type: Oral Tube size: 7.0 mm Number of attempts: 2 Airway Equipment and Method: Stylet, Oral airway and Video-laryngoscopy Placement Confirmation: ETT inserted through vocal cords under direct vision, positive ETCO2 and breath sounds checked- equal and bilateral Secured at: 22 cm Tube secured with: Tape Dental Injury: Teeth and Oropharynx as per pre-operative assessment  Difficulty Due To: Difficult Airway- due to reduced neck mobility Comments: First attempt by CRNA unsuccessful, grade 4 view with MAC 4. Second attempt successful with Glidescope S4, grade 1 view.

## 2022-12-17 NOTE — Anesthesia Postprocedure Evaluation (Signed)
Anesthesia Post Note  Patient: Mark Chapman  Procedure(s) Performed: LAPAROSCOPIC CHOLECYSTECTOMY (Abdomen)     Patient location during evaluation: PACU Anesthesia Type: General Level of consciousness: awake and alert Pain management: pain level controlled Vital Signs Assessment: post-procedure vital signs reviewed and stable Respiratory status: spontaneous breathing, nonlabored ventilation, respiratory function stable and patient connected to nasal cannula oxygen Cardiovascular status: blood pressure returned to baseline and stable Postop Assessment: no apparent nausea or vomiting Anesthetic complications: no   No notable events documented.  Last Vitals:  Vitals:   12/17/22 0915 12/17/22 0925  BP: 135/78 127/76  Pulse: 61 64  Resp: (!) 8 14  Temp:  36.4 C  SpO2: 93% 95%    Last Pain:  Vitals:   12/17/22 0925  TempSrc:   PainSc: 4                  Hazem Kenner

## 2022-12-17 NOTE — Interval H&P Note (Signed)
History and Physical Interval Note:  12/17/2022 7:19 AM  Mark Chapman  has presented today for surgery, with the diagnosis of GALLSTONES.  The various methods of treatment have been discussed with the patient and family. After consideration of risks, benefits and other options for treatment, the patient has consented to  Procedure(s): LAPAROSCOPIC CHOLECYSTECTOMY (N/A) as a surgical intervention.  The patient's history has been reviewed, patient examined, no change in status, stable for surgery.  I have reviewed the patient's chart and labs.  Questions were answered to the patient's satisfaction.     Fritzi Mandes

## 2022-12-17 NOTE — Discharge Instructions (Addendum)
[facility] DISCHARGE INSTRUCTIONS  Activity No heavy lifting greater than 15 pounds for 4 weeks after surgery. Ok to shower in 24 hours, but do not bathe or submerge incisions underwater. Do not drive while taking narcotic pain medication.  Wound Care Your incisions are covered with skin glue called Dermabond. This will peel off on its own over time. You may shower and allow warm soapy water to run over your incisions. Gently pat dry. Do not submerge your incision underwater. Monitor your incision for any new redness, tenderness, or drainage.  When to Call us: Fever greater than 100.5 New redness, drainage, or swelling at incision site Severe pain, nausea, or vomiting Jaundice (yellowing of the whites of the eyes or skin)  Follow-up You have an appointment scheduled with Dr. Freida Busman on Jan 09, 2023 at 11:10am. This will be at the [facility] Surgery office at [address], [place], [place]. Please arrive at least 15 minutes prior to your scheduled appointment time.  For questions or concerns, please call the office at [phone].

## 2022-12-17 NOTE — Transfer of Care (Signed)
Immediate Anesthesia Transfer of Care Note  Patient: Mark Chapman  Procedure(s) Performed: LAPAROSCOPIC CHOLECYSTECTOMY (Abdomen)  Patient Location: PACU  Anesthesia Type:General  Level of Consciousness: awake and alert   Airway & Oxygen Therapy: Patient Spontanous Breathing  Post-op Assessment: Report given to RN and Post -op Vital signs reviewed and stable  Post vital signs: Reviewed and stable  Last Vitals:  Vitals Value Taken Time  BP 139/114 12/17/22 0836  Temp    Pulse 74 12/17/22 0838  Resp 13 12/17/22 0838  SpO2 98 % 12/17/22 0838  Vitals shown include unvalidated device data.  Last Pain:  Vitals:   12/17/22 0610  TempSrc: Oral         Complications: No notable events documented.

## 2022-12-18 ENCOUNTER — Encounter (HOSPITAL_COMMUNITY): Payer: Self-pay | Admitting: Surgery

## 2022-12-18 LAB — SURGICAL PATHOLOGY

## 2023-04-21 ENCOUNTER — Emergency Department (HOSPITAL_COMMUNITY): Payer: 59

## 2023-04-21 ENCOUNTER — Emergency Department (HOSPITAL_COMMUNITY)
Admission: EM | Admit: 2023-04-21 | Discharge: 2023-04-21 | Disposition: A | Discharge status: Home / Self Care | Payer: 59 | Source: Home / Self Care | Attending: Emergency Medicine | Admitting: Emergency Medicine

## 2023-04-21 DIAGNOSIS — R55 Syncope and collapse: Secondary | ICD-10-CM

## 2023-04-21 DIAGNOSIS — F1110 Opioid abuse, uncomplicated: Secondary | ICD-10-CM | POA: Insufficient documentation

## 2023-04-21 DIAGNOSIS — Y92009 Unspecified place in unspecified non-institutional (private) residence as the place of occurrence of the external cause: Secondary | ICD-10-CM | POA: Insufficient documentation

## 2023-04-21 DIAGNOSIS — R519 Headache, unspecified: Secondary | ICD-10-CM | POA: Diagnosis not present

## 2023-04-21 DIAGNOSIS — R0989 Other specified symptoms and signs involving the circulatory and respiratory systems: Secondary | ICD-10-CM | POA: Diagnosis not present

## 2023-04-21 DIAGNOSIS — S20219A Contusion of unspecified front wall of thorax, initial encounter: Secondary | ICD-10-CM | POA: Insufficient documentation

## 2023-04-21 DIAGNOSIS — I10 Essential (primary) hypertension: Secondary | ICD-10-CM | POA: Insufficient documentation

## 2023-04-21 DIAGNOSIS — X58XXXA Exposure to other specified factors, initial encounter: Secondary | ICD-10-CM | POA: Insufficient documentation

## 2023-04-21 DIAGNOSIS — R4182 Altered mental status, unspecified: Secondary | ICD-10-CM | POA: Diagnosis not present

## 2023-04-21 DIAGNOSIS — Z79899 Other long term (current) drug therapy: Secondary | ICD-10-CM | POA: Insufficient documentation

## 2023-04-21 DIAGNOSIS — R000 Tachycardia, unspecified: Secondary | ICD-10-CM | POA: Diagnosis not present

## 2023-04-21 DIAGNOSIS — R569 Unspecified convulsions: Secondary | ICD-10-CM | POA: Diagnosis not present

## 2023-04-21 DIAGNOSIS — G4489 Other headache syndrome: Secondary | ICD-10-CM | POA: Diagnosis not present

## 2023-04-21 DIAGNOSIS — R079 Chest pain, unspecified: Secondary | ICD-10-CM | POA: Diagnosis not present

## 2023-04-21 LAB — COMPREHENSIVE METABOLIC PANEL
ALT: 30 U/L (ref 0–44)
AST: 26 U/L (ref 15–41)
Albumin: 3.9 g/dL (ref 3.5–5.0)
Alkaline Phosphatase: 56 U/L (ref 38–126)
Anion gap: 10 (ref 5–15)
BUN: 14 mg/dL (ref 8–23)
CO2: 27 mmol/L (ref 22–32)
Calcium: 9 mg/dL (ref 8.9–10.3)
Chloride: 105 mmol/L (ref 98–111)
Creatinine, Ser: 1.16 mg/dL (ref 0.61–1.24)
GFR, Estimated: >60 mL/min (ref >60)
Glucose, Bld: 118 mg/dL — ABNORMAL HIGH (ref 70–99)
Potassium: 3.7 mmol/L (ref 3.5–5.1)
Sodium: 142 mmol/L (ref 135–145)
Total Bilirubin: 0.7 mg/dL (ref 0.3–1.2)
Total Protein: 6.8 g/dL (ref 6.5–8.1)

## 2023-04-21 LAB — CBC WITH DIFFERENTIAL/PLATELET
Abs Immature Granulocytes: 0.08 10*3/uL — ABNORMAL HIGH (ref 0.00–0.07)
Basophils Absolute: 0 10*3/uL (ref 0.0–0.1)
Basophils Relative: 0 %
Eosinophils Absolute: 0.2 10*3/uL (ref 0.0–0.5)
Eosinophils Relative: 2 %
HCT: 42.9 % (ref 39.0–52.0)
Hemoglobin: 14.2 g/dL (ref 13.0–17.0)
Immature Granulocytes: 1 %
Lymphocytes Relative: 11 %
Lymphs Abs: 1 10*3/uL (ref 0.7–4.0)
MCH: 31.8 pg (ref 26.0–34.0)
MCHC: 33.1 g/dL (ref 30.0–36.0)
MCV: 96.2 fL (ref 80.0–100.0)
Monocytes Absolute: 0.6 10*3/uL (ref 0.1–1.0)
Monocytes Relative: 6 %
Neutro Abs: 7.3 10*3/uL (ref 1.7–7.7)
Neutrophils Relative %: 80 %
Platelets: 188 10*3/uL (ref 150–400)
RBC: 4.46 MIL/uL (ref 4.22–5.81)
RDW: 12.8 % (ref 11.5–15.5)
WBC: 9.1 10*3/uL (ref 4.0–10.5)
nRBC: 0 % (ref 0.0–0.2)

## 2023-04-21 LAB — URINALYSIS, ROUTINE W REFLEX MICROSCOPIC
Bilirubin Urine: NEGATIVE
Glucose, UA: NEGATIVE mg/dL
Hgb urine dipstick: NEGATIVE
Ketones, ur: NEGATIVE mg/dL
Leukocytes,Ua: NEGATIVE
Nitrite: NEGATIVE
Protein, ur: 30 mg/dL — AB
Specific Gravity, Urine: 1.024 (ref 1.005–1.030)
pH: 5 (ref 5.0–8.0)

## 2023-04-21 LAB — RAPID URINE DRUG SCREEN, HOSP PERFORMED
Amphetamines: NOT DETECTED
Barbiturates: NOT DETECTED
Benzodiazepines: NOT DETECTED
Cocaine: NOT DETECTED
Opiates: POSITIVE — AB
Tetrahydrocannabinol: NOT DETECTED

## 2023-04-21 LAB — SALICYLATE LEVEL: Salicylate Lvl: <7 mg/dL — ABNORMAL LOW (ref 7.0–30.0)

## 2023-04-21 LAB — ACETAMINOPHEN LEVEL: Acetaminophen (Tylenol), Serum: <10 ug/mL — ABNORMAL LOW (ref 10–30)

## 2023-04-21 LAB — ETHANOL: Alcohol, Ethyl (B): <10 mg/dL (ref <10)

## 2023-04-21 LAB — TROPONIN I (HIGH SENSITIVITY): Troponin I (High Sensitivity): 11 ng/L (ref <18)

## 2023-04-21 MED ORDER — OXYCODONE HCL 5 MG PO TABS
5.0000 mg | ORAL_TABLET | Freq: Once | ORAL | Status: AC
  Administered 2023-04-21: 5 mg via ORAL
  Filled 2023-04-21: qty 1

## 2023-04-21 MED ORDER — ONDANSETRON HCL 4 MG/2ML IJ SOLN
4.0000 mg | Freq: Once | INTRAMUSCULAR | Status: AC
  Administered 2023-04-21: 4 mg via INTRAVENOUS
  Filled 2023-04-21: qty 2

## 2023-04-21 NOTE — ED Provider Notes (Signed)
[facility] AT [facility] Provider Note   CSN: [id number] Arrival date & time: 04/21/23  1819     History {Add pertinent medical, surgical, social history, OB history to HPI:1} No chief complaint on file.   Dnaiel Chapman is a 67 y.o. male.  HPI 67 year old male history of GERD, hyperlipidemia, hypertension presenting for syncopal episode.  Patient states he was at his friend's house today when he was drinking some.  He reports he had mild headache for the last few days, and his friend gave him something to snort to help with the pain.  He is unsure what this was.  Reportedly he lost consciousness and was breathing and did have a pulse but is friends did 4 minutes of CPR.  He also got 4 mg of Narcan with bystanders or EMS, unclear.  With EMS he never was pulseless and did not require any CPR.  Here he is awake and alert, although complains of severe chest wall pain from where he had CPR.  Denies shortness of breath.  He is unsure what drug his friend gave him.  Denies any other illicit drug use.  No nausea or vomiting.  No shortness of breath.  No abdominal pain.  No vision changes or weakness or numbness.     Home Medications Prior to Admission medications   Medication Sig Start Date End Date Taking? Authorizing Provider  atorvastatin (LIPITOR) 20 MG tablet Take 20 mg by mouth daily.    [provider]  hydrocortisone (ANUSOL-HC) 2.5 % rectal cream Place 1 Application rectally 2 (two) times daily. Patient not taking: Reported on 12/13/2022 10/03/22   Sponseller, Eugene Gavia, PA-C  lisinopril-hydrochlorothiazide (PRINZIDE,ZESTORETIC) 20-12.5 MG per tablet Take 1 tablet by mouth daily.    [provider]  Multiple Vitamins-Minerals (ADULT GUMMY PO) Take 1 capsule by mouth 2 (two) times daily.    [provider]  omeprazole (PRILOSEC) 40 MG capsule Take 40 mg by mouth daily as needed (for stomach pain).    [provider]   sildenafil (REVATIO) 20 MG tablet Take 60-80 mg by mouth daily as needed (ED).    [provider]      Allergies    Patient has no known allergies.    Review of Systems   Review of Systems Review of systems completed and notable as per HPI.  ROS otherwise negative.   Physical Exam Updated Vital Signs There were no vitals taken for this visit. Physical Exam Vitals and nursing note reviewed.  Constitutional:      General: He is not in acute distress.    Appearance: He is well-developed.  HENT:     Head: Normocephalic and atraumatic.  Eyes:     Conjunctiva/sclera: Conjunctivae normal.  Cardiovascular:     Rate and Rhythm: Normal rate and regular rhythm.     Heart sounds: No murmur heard. Pulmonary:     Effort: Pulmonary effort is normal. No respiratory distress.     Breath sounds: Normal breath sounds.  Abdominal:     Palpations: Abdomen is soft.     Tenderness: There is no abdominal tenderness.  Musculoskeletal:        General: No swelling.     Cervical back: Neck supple.     Comments: Tenderness and bruising over the sternum  Skin:    General: Skin is warm and dry.     Capillary Refill: Capillary refill takes less than 2 seconds.  Neurological:     General: No  focal deficit present.     Mental Status: He is alert and oriented to person, place, and time. Mental status is at baseline.     Cranial Nerves: No cranial nerve deficit.     Sensory: No sensory deficit.     Motor: No weakness.  Psychiatric:        Mood and Affect: Mood normal.     ED Results / Procedures / Treatments   Labs (all labs ordered are listed, but only abnormal results are displayed) Labs Reviewed - No data to display  EKG None  Radiology No results found.  Procedures Procedures  {Document cardiac monitor, telemetry assessment procedure when appropriate:1}  Medications Ordered in ED Medications - No data to display  ED Course/ Medical Decision Making/ A&P   {   Click  here for ABCD2, HEART and other calculatorsREFRESH Note before signing :1}                              Medical Decision Making Amount and/or Complexity of Data Reviewed Labs: ordered. Radiology: ordered.   Medical Decision Making:   Chesley Ferrare is a 67 y.o. male who presented to the ED today with    {crccomplexity:27900} Reviewed and confirmed nursing documentation for past medical history, family history, social history.  Initial Study Results:   Laboratory  All laboratory results reviewed.  Labs notable for ***  ***EKG EKG was reviewed independently. Rate, rhythm, axis, intervals all examined and without medically relevant abnormality. ST segments without concerns for elevations.    Radiology:  All images reviewed independently. ***Agree with radiology report at this time.      Consults: Case discussed with ***.   Reassessment and Plan:   ***    Patient's presentation is most consistent with {EM COPA:27473}     {Document critical care time when appropriate:1} {Document review of labs and clinical decision tools ie heart score, Chads2Vasc2 etc:1}  {Document your independent review of radiology images, and any outside records:1} {Document your discussion with family members, caretakers, and with consultants:1} {Document social determinants of health affecting pt's care:1} {Document your decision making why or why not admission, treatments were needed:1} Final Clinical Impression(s) / ED Diagnoses Final diagnoses:  None    Rx / DC Orders ED Discharge Orders     None

## 2023-04-21 NOTE — ED Triage Notes (Signed)
Pt BIB EMS from friends house, pt consumed about "4 cups of vodka" per EMS, had a syncopal episode and bystanders said he was having "seizure like activity" and started CPR. Pt had a pulse but bystanders did chest compressions and gave 4mg  Narcan. AOX4 on arrival.

## 2023-04-21 NOTE — Discharge Instructions (Signed)
I recommend you call your doctor tomorrow to schedule follow-up.  If you develop chest pain, shortness of breath, fainting you should return to the ED.

## 2023-04-21 NOTE — ED Notes (Signed)
Pt said his friend gave him something that he sniffed up his nose, isn't sure what it was "maybe heroin" per patient

## 2023-04-21 NOTE — ED Notes (Signed)
Pt transported to CT ?

## 2023-04-23 DIAGNOSIS — X58XXXA Exposure to other specified factors, initial encounter: Secondary | ICD-10-CM | POA: Diagnosis not present

## 2023-04-23 DIAGNOSIS — R0789 Other chest pain: Secondary | ICD-10-CM | POA: Diagnosis not present

## 2023-04-23 DIAGNOSIS — S20219A Contusion of unspecified front wall of thorax, initial encounter: Secondary | ICD-10-CM | POA: Diagnosis not present

## 2023-04-24 DIAGNOSIS — R079 Chest pain, unspecified: Secondary | ICD-10-CM | POA: Diagnosis not present

## 2023-04-25 DIAGNOSIS — L821 Other seborrheic keratosis: Secondary | ICD-10-CM | POA: Diagnosis not present

## 2023-04-25 DIAGNOSIS — D485 Neoplasm of uncertain behavior of skin: Secondary | ICD-10-CM | POA: Diagnosis not present

## 2023-04-25 DIAGNOSIS — L814 Other melanin hyperpigmentation: Secondary | ICD-10-CM | POA: Diagnosis not present

## 2023-04-25 DIAGNOSIS — D225 Melanocytic nevi of trunk: Secondary | ICD-10-CM | POA: Diagnosis not present

## 2023-05-20 ENCOUNTER — Other Ambulatory Visit: Payer: Self-pay | Admitting: Family Medicine

## 2023-05-20 DIAGNOSIS — Z0000 Encounter for general adult medical examination without abnormal findings: Secondary | ICD-10-CM | POA: Diagnosis not present

## 2023-05-20 DIAGNOSIS — Z8719 Personal history of other diseases of the digestive system: Secondary | ICD-10-CM | POA: Diagnosis not present

## 2023-05-20 DIAGNOSIS — E782 Mixed hyperlipidemia: Secondary | ICD-10-CM | POA: Diagnosis not present

## 2023-05-20 DIAGNOSIS — M5030 Other cervical disc degeneration, unspecified cervical region: Secondary | ICD-10-CM | POA: Diagnosis not present

## 2023-05-20 DIAGNOSIS — Z8619 Personal history of other infectious and parasitic diseases: Secondary | ICD-10-CM | POA: Diagnosis not present

## 2023-05-20 DIAGNOSIS — K219 Gastro-esophageal reflux disease without esophagitis: Secondary | ICD-10-CM | POA: Diagnosis not present

## 2023-05-20 DIAGNOSIS — I10 Essential (primary) hypertension: Secondary | ICD-10-CM | POA: Diagnosis not present

## 2023-05-20 DIAGNOSIS — Z136 Encounter for screening for cardiovascular disorders: Secondary | ICD-10-CM

## 2023-05-27 ENCOUNTER — Ambulatory Visit
Admission: RE | Admit: 2023-05-27 | Discharge: 2023-05-27 | Disposition: A | Discharge status: Home / Self Care | Payer: 59 | Source: Ambulatory Visit | Attending: Family Medicine | Admitting: Family Medicine

## 2023-05-27 DIAGNOSIS — I10 Essential (primary) hypertension: Secondary | ICD-10-CM | POA: Diagnosis not present

## 2023-05-27 DIAGNOSIS — Z136 Encounter for screening for cardiovascular disorders: Secondary | ICD-10-CM | POA: Diagnosis not present

## 2023-05-27 DIAGNOSIS — E785 Hyperlipidemia, unspecified: Secondary | ICD-10-CM | POA: Diagnosis not present

## 2023-09-20 DIAGNOSIS — R109 Unspecified abdominal pain: Secondary | ICD-10-CM | POA: Diagnosis not present

## 2023-09-20 DIAGNOSIS — K31A15 Gastric intestinal metaplasia without dysplasia, involving multiple sites: Secondary | ICD-10-CM | POA: Diagnosis not present

## 2023-09-20 DIAGNOSIS — C189 Malignant neoplasm of colon, unspecified: Secondary | ICD-10-CM | POA: Diagnosis not present

## 2023-11-18 DIAGNOSIS — E782 Mixed hyperlipidemia: Secondary | ICD-10-CM | POA: Diagnosis not present

## 2023-11-18 DIAGNOSIS — Z8719 Personal history of other diseases of the digestive system: Secondary | ICD-10-CM | POA: Diagnosis not present

## 2023-11-18 DIAGNOSIS — M5030 Other cervical disc degeneration, unspecified cervical region: Secondary | ICD-10-CM | POA: Diagnosis not present

## 2023-11-18 DIAGNOSIS — Z8619 Personal history of other infectious and parasitic diseases: Secondary | ICD-10-CM | POA: Diagnosis not present

## 2023-11-18 DIAGNOSIS — I10 Essential (primary) hypertension: Secondary | ICD-10-CM | POA: Diagnosis not present

## 2023-11-18 DIAGNOSIS — Z8669 Personal history of other diseases of the nervous system and sense organs: Secondary | ICD-10-CM | POA: Diagnosis not present

## 2023-11-18 DIAGNOSIS — R7303 Prediabetes: Secondary | ICD-10-CM | POA: Diagnosis not present

## 2023-11-18 DIAGNOSIS — K219 Gastro-esophageal reflux disease without esophagitis: Secondary | ICD-10-CM | POA: Diagnosis not present

## 2024-02-09 ENCOUNTER — Encounter (HOSPITAL_COMMUNITY): Payer: Self-pay

## 2024-02-09 ENCOUNTER — Other Ambulatory Visit: Payer: Self-pay

## 2024-02-09 ENCOUNTER — Emergency Department (HOSPITAL_COMMUNITY)
Admission: EM | Admit: 2024-02-09 | Discharge: 2024-02-09 | Disposition: A | Discharge status: Home / Self Care | Attending: Emergency Medicine | Admitting: Emergency Medicine

## 2024-02-09 DIAGNOSIS — R059 Cough, unspecified: Secondary | ICD-10-CM | POA: Diagnosis not present

## 2024-02-09 DIAGNOSIS — Z79899 Other long term (current) drug therapy: Secondary | ICD-10-CM | POA: Insufficient documentation

## 2024-02-09 DIAGNOSIS — J069 Acute upper respiratory infection, unspecified: Secondary | ICD-10-CM | POA: Diagnosis not present

## 2024-02-09 DIAGNOSIS — I10 Essential (primary) hypertension: Secondary | ICD-10-CM | POA: Insufficient documentation

## 2024-02-09 LAB — RESP PANEL BY RT-PCR (RSV, FLU A&B, COVID)  RVPGX2
Influenza A by PCR: NEGATIVE
Influenza B by PCR: NEGATIVE
Resp Syncytial Virus by PCR: NEGATIVE
SARS Coronavirus 2 by RT PCR: NEGATIVE

## 2024-02-09 MED ORDER — ACETAMINOPHEN 500 MG PO TABS
1000.0000 mg | ORAL_TABLET | Freq: Once | ORAL | Status: AC
  Administered 2024-02-09: 1000 mg via ORAL
  Filled 2024-02-09: qty 2

## 2024-02-09 MED ORDER — BENZONATATE 100 MG PO CAPS: 200.0000 mg | ORAL_CAPSULE | Freq: Two times a day (BID) | ORAL | 0 refills | Status: AC | PRN

## 2024-02-09 NOTE — ED Triage Notes (Signed)
 Pt c/o cough, sore throat, and nasal congestion x 2 days ago. Pt has take OTC meds for same.

## 2024-02-09 NOTE — ED Provider Notes (Signed)
 [facility] Provider Note   CSN: [id number] Arrival date & time: 02/09/24  0747     Patient presents with: Cough   Mark Chapman is a 68 y.o. male with PMHx GERD, HLD, HTN, anxiety, depression who presents to ED concerned for productive cough, sore throat, nasal congestion, rhinorrhea, body aches gx2 days. Patient has been taking dayquil/nightquil for his symptoms without much relief. Patient endorses possible sick contact with someone from work. Patient also endorsing intermittent diarrhea x2 days.  Denies fever, SOB/dyspnea, nausea, vomiting.    Cough      Prior to Admission medications   Medication Sig Start Date End Date Taking? Authorizing Provider  benzonatate (TESSALON) 100 MG capsule Take 2 capsules (200 mg total) by mouth 2 (two) times daily as needed for cough. 02/09/24  Yes Dorisann Garre F, PA-C  atorvastatin (LIPITOR) 20 MG tablet Take 20 mg by mouth daily.    [provider]  hydrocortisone  (ANUSOL -HC) 2.5 % rectal cream Place 1 Application rectally 2 (two) times daily. Patient not taking: Reported on 12/13/2022 10/03/22   Sponseller, Adelle Agent, PA-C  lisinopril-hydrochlorothiazide (PRINZIDE,ZESTORETIC) 20-12.5 MG per tablet Take 1 tablet by mouth daily.    [provider]  Multiple Vitamins-Minerals (ADULT GUMMY PO) Take 1 capsule by mouth 2 (two) times daily.    [provider]  omeprazole (PRILOSEC) 40 MG capsule Take 40 mg by mouth daily as needed (for stomach pain).    [provider]  sildenafil (REVATIO) 20 MG tablet Take 60-80 mg by mouth daily as needed (ED).    [provider]    Allergies: Patient has no known allergies.    Review of Systems  Respiratory:  Positive for cough.     Updated Vital Signs BP (!) 148/89 (BP Location: Right Arm)   Pulse 76   Temp 97.9 F (36.6 C)   Resp 16   Ht 5' 7 (1.702 m)   Wt 72 kg   SpO2 97%   BMI 24.86 kg/m    Physical Exam Vitals and nursing note reviewed.  Constitutional:      General: He is not in acute distress.    Appearance: He is not ill-appearing or toxic-appearing.  HENT:     Head: Normocephalic and atraumatic.     Mouth/Throat:     Mouth: Mucous membranes are moist.     Comments: S/p tonsillectomy. Mild posterior oropharyngeal erythema.  Eyes:     General: No scleral icterus.       Right eye: No discharge.        Left eye: No discharge.     Conjunctiva/sclera: Conjunctivae normal.    Cardiovascular:     Rate and Rhythm: Normal rate and regular rhythm.     Pulses: Normal pulses.     Heart sounds: Normal heart sounds. No murmur heard. Pulmonary:     Effort: Pulmonary effort is normal. No respiratory distress.     Breath sounds: Normal breath sounds. No wheezing, rhonchi or rales.  Abdominal:     General: Abdomen is flat.   Musculoskeletal:     Right lower leg: No edema.     Left lower leg: No edema.   Skin:    General: Skin is warm and dry.     Findings: No rash.   Neurological:     General: No focal deficit present.     Mental Status: He is alert and oriented to person, place, and time. Mental status is at  baseline.   Psychiatric:        Mood and Affect: Mood normal.        Behavior: Behavior normal.     (all labs ordered are listed, but only abnormal results are displayed) Labs Reviewed  RESP PANEL BY RT-PCR (RSV, FLU A&B, COVID)  [id number]    EKG: None  Radiology: No results found.   Procedures   Medications Ordered in the ED  acetaminophen  (TYLENOL ) tablet 1,000 mg (1,000 mg Oral Given 02/09/24 0857)                                    Medical Decision Making Risk OTC drugs. Prescription drug management.    This patient presents to the ED for concern of cough, rhinorrhea, congestion, body aches, sore throat, this involves an extensive number of treatment options, and is a complaint that carries with it a high risk of complications and  morbidity.  The differential diagnosis includes Flu/COVID/RSV, strep pharyngitis, sinusitis, peritonsillar abscess, retropharyngeal abscess, pneumonia, meningitis.   Co morbidities that complicate the patient evaluation  GERD, HLD, HTN, anxiety, depression   Additional history obtained:  Dr. Janifer Meigs PCP   Problem List / ED Course / Critical interventions / Medication management  Patient presents to ED concern for rhinorrhea, nasal congestion, sore throat, productive cough, body aches x 2 days.  Physical exam reassuring.  Patient afebrile with stable vitals. I Ordered, and personally interpreted labs.  Respiratory panel negative. It appears patient is suffering from a viral URI.  Will prescribe patient Tessalon Perles.  Educated patient alternating Advil and Tylenol  for body aches.  Recommended following up with PCP.   Staffed with Dr. Tamela Fake I have reviewed the patients home medicines and have made adjustments as needed The patient has been appropriately medically screened and/or stabilized in the ED. I have low suspicion for any other emergent medical condition which would require further screening, evaluation or treatment in the ED or require inpatient management. At time of discharge the patient is hemodynamically stable and in no acute distress. I have discussed work-up results and diagnosis with patient and answered all questions. Patient is agreeable with discharge plan. We discussed strict return precautions for returning to the emergency department and they verbalized understanding.     DDx: These are considered less likely due to history of present illness and physical exam findings -Peritonsillar/Retropharyngeal abscess: no throat/oral swelling appreciated -PNA: Lungs clear to auscultation bilaterally; no fever or SOB -Meningitis: patient's symptoms, vital signs, physical exam findings including lack of meningismus seem grossly less consistent at this time -Strep pharyngitis:  no tonsillar swelling/exudates appreciated   Social Determinants of Health:  none      Final diagnoses:  Viral URI with cough    ED Discharge Orders          Ordered    benzonatate (TESSALON) 100 MG capsule  2 times daily PRN        02/09/24 1000               [place], [place] 02/09/24 1044    Scarlette Currier, MD 02/10/24 2314

## 2024-02-09 NOTE — Discharge Instructions (Addendum)
 You are negative for COVID, flu A, flu B, and RSV.  It appears you are suffering from a different strand of virus which is giving you a upper respiratory infection.  Please follow-up with your primary care provider.  Seek emergency care if experiencing any new or worsening symptoms.  Alternating between 650 mg Tylenol  and 400 mg Advil: The best way to alternate taking Acetaminophen  (example Tylenol ) and Ibuprofen (example Advil/Motrin) is to take them 3 hours apart. For example, if you take ibuprofen at 6 am you can then take Tylenol  at 9 am. You can continue this regimen throughout the day, making sure you do not exceed the recommended maximum dose for each drug.

## 2024-02-10 DIAGNOSIS — R062 Wheezing: Secondary | ICD-10-CM | POA: Diagnosis not present

## 2024-02-10 DIAGNOSIS — R051 Acute cough: Secondary | ICD-10-CM | POA: Diagnosis not present

## 2024-02-10 DIAGNOSIS — J209 Acute bronchitis, unspecified: Secondary | ICD-10-CM | POA: Diagnosis not present

## 2024-04-23 DIAGNOSIS — L814 Other melanin hyperpigmentation: Secondary | ICD-10-CM | POA: Diagnosis not present

## 2024-04-23 DIAGNOSIS — D225 Melanocytic nevi of trunk: Secondary | ICD-10-CM | POA: Diagnosis not present

## 2024-04-23 DIAGNOSIS — L578 Other skin changes due to chronic exposure to nonionizing radiation: Secondary | ICD-10-CM | POA: Diagnosis not present

## 2024-05-26 DIAGNOSIS — M5030 Other cervical disc degeneration, unspecified cervical region: Secondary | ICD-10-CM | POA: Diagnosis not present

## 2024-05-26 DIAGNOSIS — L308 Other specified dermatitis: Secondary | ICD-10-CM | POA: Diagnosis not present

## 2024-05-26 DIAGNOSIS — Z0000 Encounter for general adult medical examination without abnormal findings: Secondary | ICD-10-CM | POA: Diagnosis not present

## 2024-05-26 DIAGNOSIS — E782 Mixed hyperlipidemia: Secondary | ICD-10-CM | POA: Diagnosis not present

## 2024-05-26 DIAGNOSIS — Z23 Encounter for immunization: Secondary | ICD-10-CM | POA: Diagnosis not present

## 2024-05-26 DIAGNOSIS — K219 Gastro-esophageal reflux disease without esophagitis: Secondary | ICD-10-CM | POA: Diagnosis not present

## 2024-05-26 DIAGNOSIS — Z8669 Personal history of other diseases of the nervous system and sense organs: Secondary | ICD-10-CM | POA: Diagnosis not present

## 2024-05-26 DIAGNOSIS — M25551 Pain in right hip: Secondary | ICD-10-CM | POA: Diagnosis not present

## 2024-05-26 DIAGNOSIS — R7303 Prediabetes: Secondary | ICD-10-CM | POA: Diagnosis not present

## 2024-05-26 DIAGNOSIS — I10 Essential (primary) hypertension: Secondary | ICD-10-CM | POA: Diagnosis not present

## 2024-06-03 DIAGNOSIS — L819 Disorder of pigmentation, unspecified: Secondary | ICD-10-CM | POA: Diagnosis not present
